# Patient Record
Sex: Female | Born: 1960 | Race: White | Hispanic: No | Marital: Married | Smoking: Never smoker
Health system: Southern US, Community
[De-identification: ages and names within clinical notes are randomized; demographics above are authoritative.]

## PROBLEM LIST (undated history)

## (undated) DIAGNOSIS — I10 Essential (primary) hypertension: Secondary | ICD-10-CM

## (undated) DIAGNOSIS — J302 Other seasonal allergic rhinitis: Secondary | ICD-10-CM

## (undated) DIAGNOSIS — N3281 Overactive bladder: Secondary | ICD-10-CM

## (undated) DIAGNOSIS — G4733 Obstructive sleep apnea (adult) (pediatric): Secondary | ICD-10-CM

## (undated) HISTORY — PX: BILATERAL TEMPOROMANDIBULAR JOINT ARTHROPLASTY: SUR77

## (undated) HISTORY — DX: Obstructive sleep apnea (adult) (pediatric): G47.33

## (undated) HISTORY — DX: Overactive bladder: N32.81

## (undated) HISTORY — PX: REDUCTION MAMMAPLASTY: SUR839

## (undated) HISTORY — DX: Essential (primary) hypertension: I10

## (undated) HISTORY — DX: Other seasonal allergic rhinitis: J30.2

---

## 1998-01-22 ENCOUNTER — Other Ambulatory Visit: Admission: RE | Admit: 1998-01-22 | Discharge: 1998-01-22 | Payer: Self-pay | Admitting: Obstetrics and Gynecology

## 1999-02-11 ENCOUNTER — Other Ambulatory Visit: Admission: RE | Admit: 1999-02-11 | Discharge: 1999-02-11 | Payer: Self-pay | Admitting: Obstetrics and Gynecology

## 1999-09-01 ENCOUNTER — Other Ambulatory Visit: Admission: RE | Admit: 1999-09-01 | Discharge: 1999-09-01 | Payer: Self-pay | Admitting: Obstetrics and Gynecology

## 1999-12-26 ENCOUNTER — Inpatient Hospital Stay (HOSPITAL_COMMUNITY): Admission: AD | Admit: 1999-12-26 | Discharge: 1999-12-26 | Payer: Self-pay | Admitting: Obstetrics and Gynecology

## 1999-12-26 ENCOUNTER — Encounter: Payer: Self-pay | Admitting: Obstetrics and Gynecology

## 2000-03-15 HISTORY — PX: TUBAL LIGATION: SHX77

## 2000-03-22 ENCOUNTER — Inpatient Hospital Stay (HOSPITAL_COMMUNITY): Admission: AD | Admit: 2000-03-22 | Discharge: 2000-03-25 | Payer: Self-pay | Admitting: Obstetrics and Gynecology

## 2000-03-22 ENCOUNTER — Encounter (INDEPENDENT_AMBULATORY_CARE_PROVIDER_SITE_OTHER): Payer: Self-pay | Admitting: Specialist

## 2000-04-15 ENCOUNTER — Encounter: Admission: RE | Admit: 2000-04-15 | Discharge: 2000-05-13 | Payer: Self-pay | Admitting: Obstetrics and Gynecology

## 2000-04-28 ENCOUNTER — Other Ambulatory Visit: Admission: RE | Admit: 2000-04-28 | Discharge: 2000-04-28 | Payer: Self-pay | Admitting: Obstetrics and Gynecology

## 2001-05-02 ENCOUNTER — Other Ambulatory Visit: Admission: RE | Admit: 2001-05-02 | Discharge: 2001-05-02 | Payer: Self-pay | Admitting: Obstetrics and Gynecology

## 2001-12-13 HISTORY — PX: ABDOMINAL HYSTERECTOMY: SHX81

## 2001-12-27 ENCOUNTER — Observation Stay (HOSPITAL_COMMUNITY): Admission: RE | Admit: 2001-12-27 | Discharge: 2001-12-28 | Payer: Self-pay | Admitting: Obstetrics and Gynecology

## 2001-12-27 ENCOUNTER — Encounter (INDEPENDENT_AMBULATORY_CARE_PROVIDER_SITE_OTHER): Payer: Self-pay | Admitting: *Deleted

## 2002-05-24 ENCOUNTER — Other Ambulatory Visit: Admission: RE | Admit: 2002-05-24 | Discharge: 2002-05-24 | Payer: Self-pay | Admitting: Obstetrics and Gynecology

## 2003-07-18 ENCOUNTER — Other Ambulatory Visit: Admission: RE | Admit: 2003-07-18 | Discharge: 2003-07-18 | Payer: Self-pay | Admitting: Obstetrics and Gynecology

## 2004-09-01 ENCOUNTER — Other Ambulatory Visit: Admission: RE | Admit: 2004-09-01 | Discharge: 2004-09-01 | Payer: Self-pay | Admitting: Obstetrics and Gynecology

## 2005-09-21 ENCOUNTER — Other Ambulatory Visit: Admission: RE | Admit: 2005-09-21 | Discharge: 2005-09-21 | Payer: Self-pay | Admitting: Obstetrics and Gynecology

## 2008-05-16 ENCOUNTER — Encounter: Admission: RE | Admit: 2008-05-16 | Discharge: 2008-05-16 | Payer: Self-pay | Admitting: Obstetrics and Gynecology

## 2008-05-16 IMAGING — MG MM SCREEN MAMMOGRAM BILATERAL
4 series · 4 of 4 positions shown · non-contrast
Comparison: none

DG SCREEN MAMMOGRAM BILATERAL
Bilateral CC and MLO view(s) were taken.

DIGITAL SCREENING MAMMOGRAM WITH CAD:
There are scattered fibroglandular densities.  No masses or malignant type calcifications are 
identified.  Compared with prior studies.

[R CC]
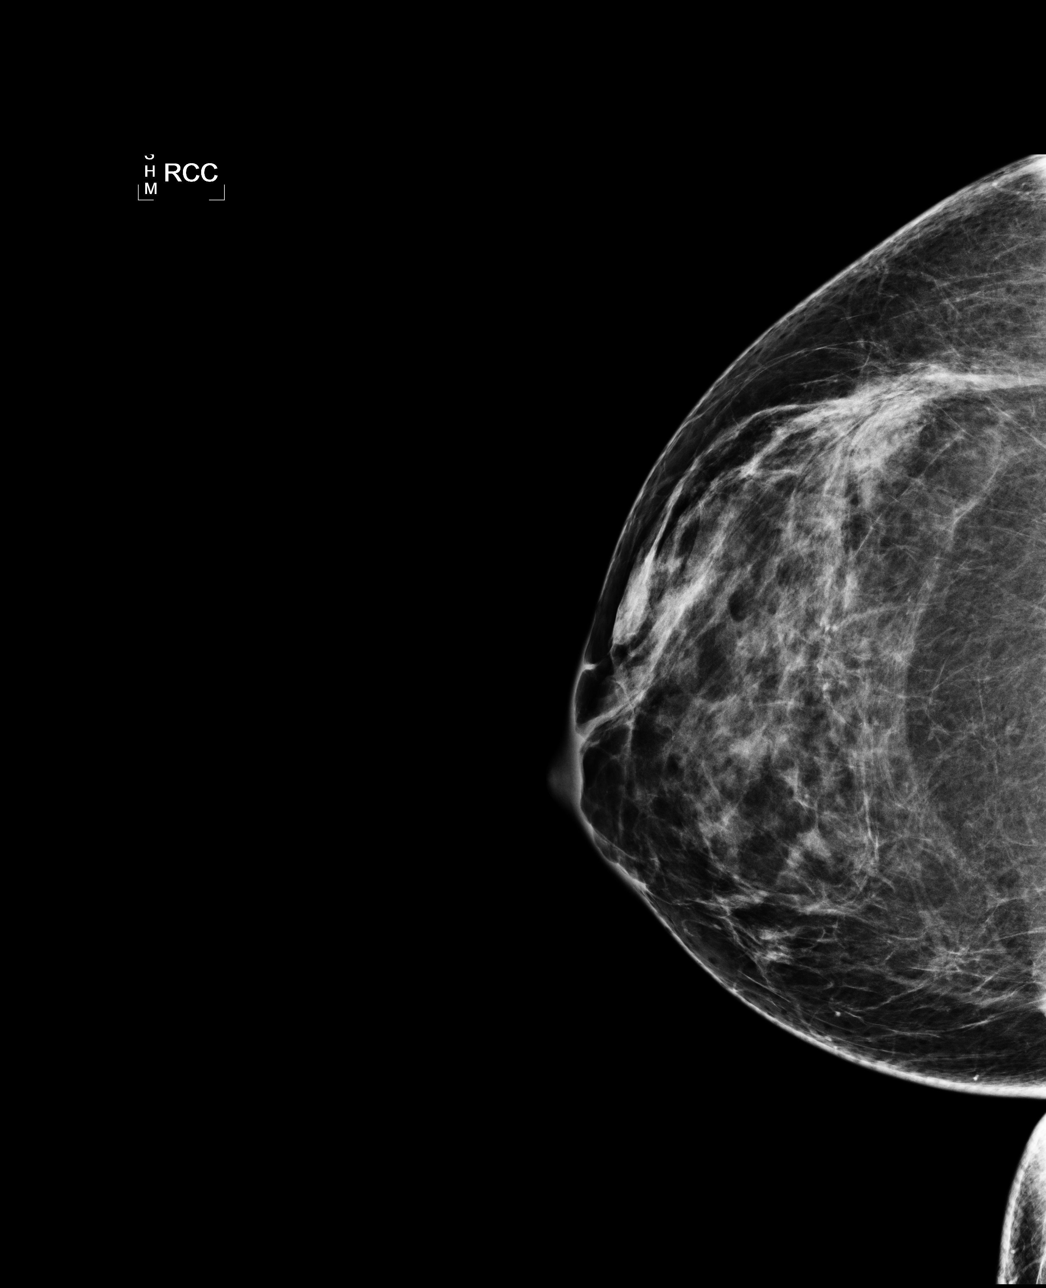

[L CC]
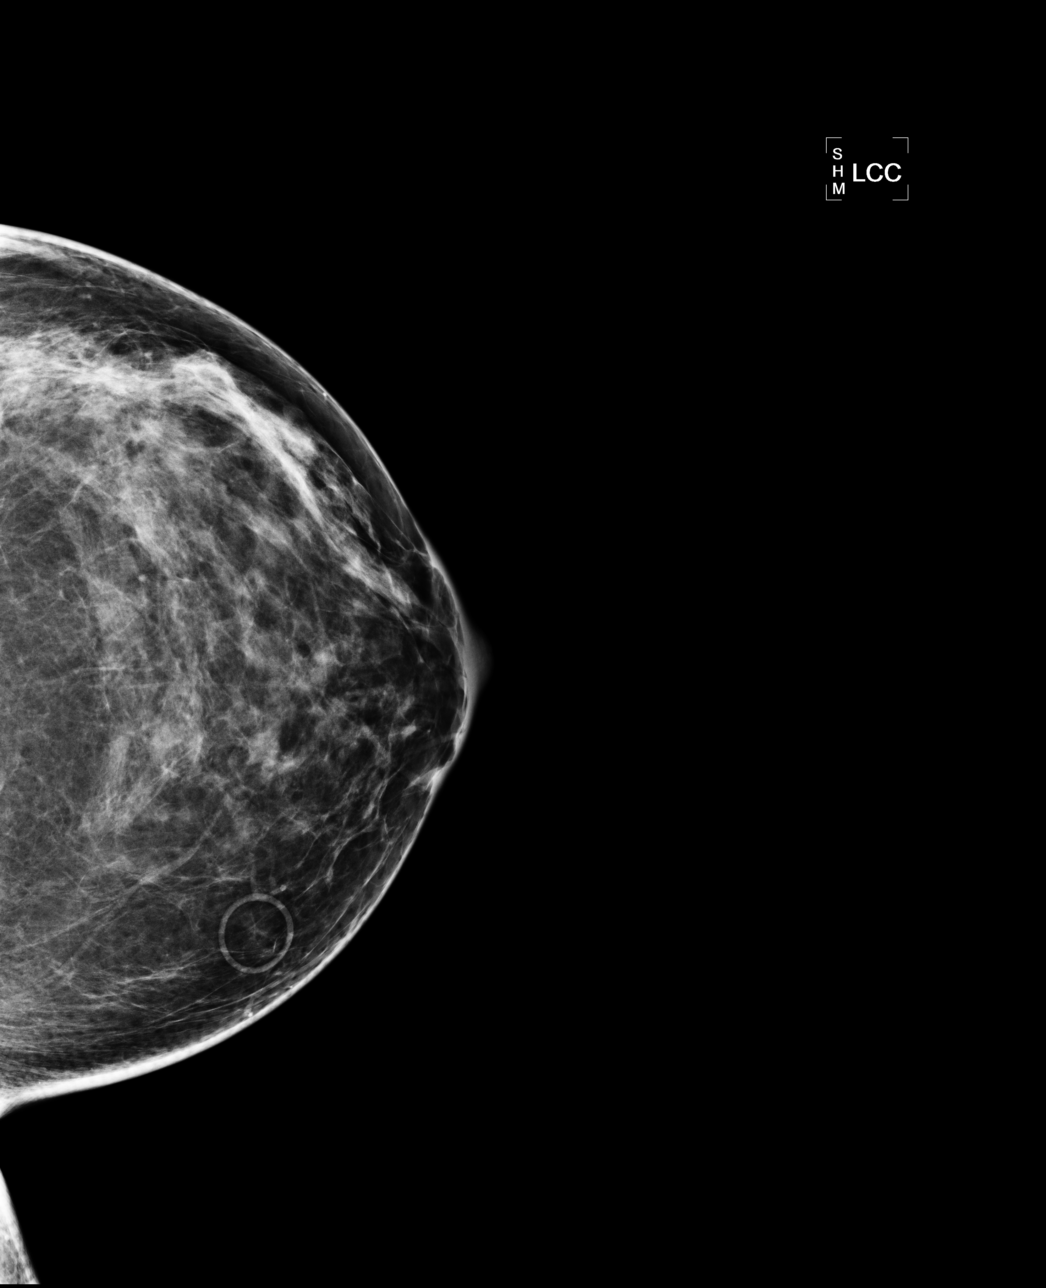

[L MLO]
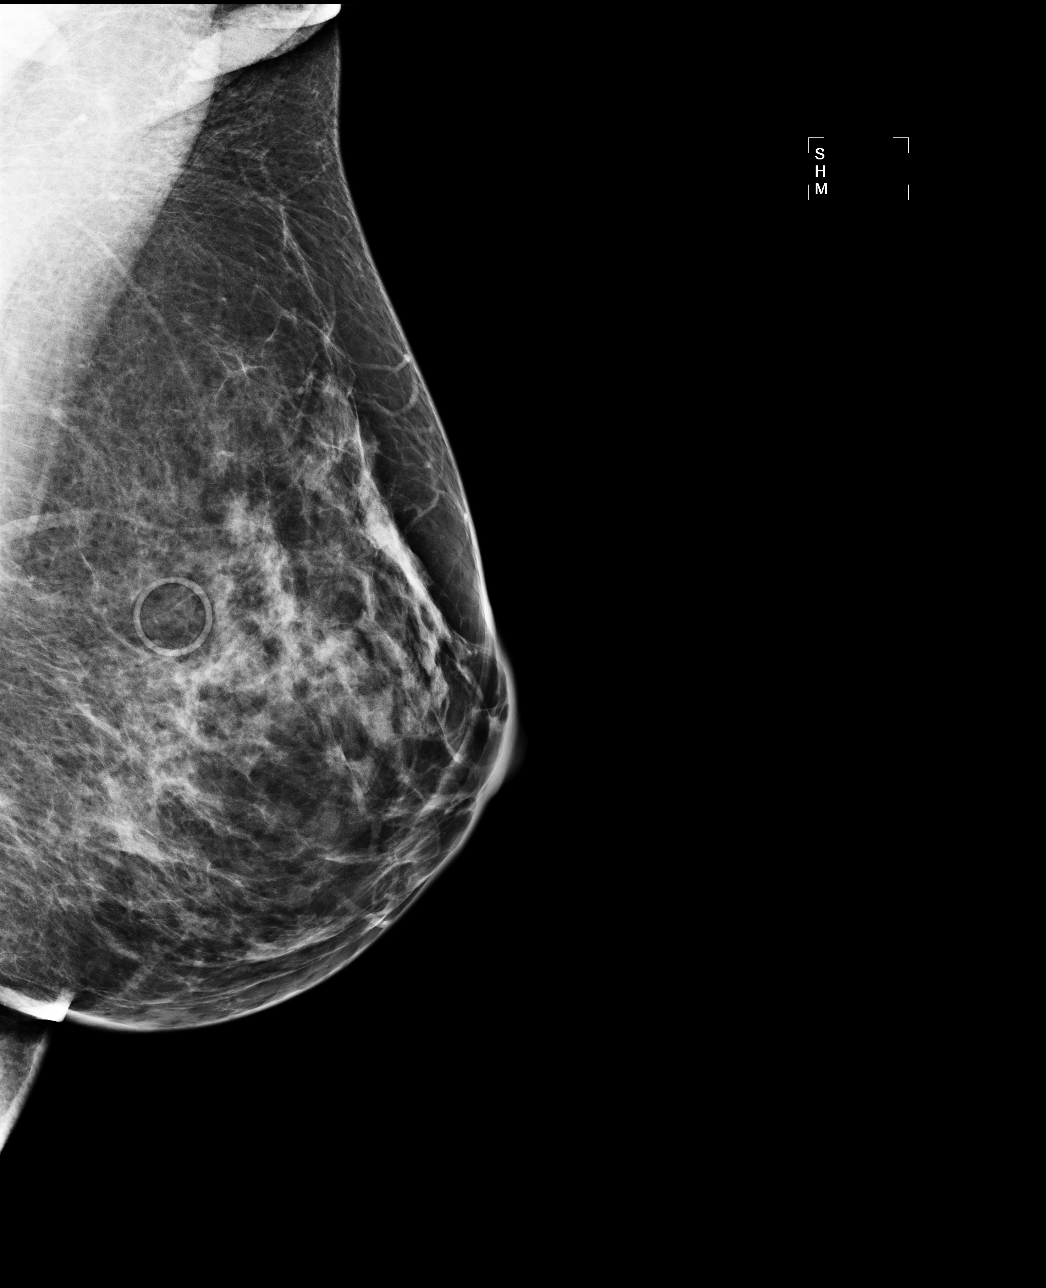

[R MLO]
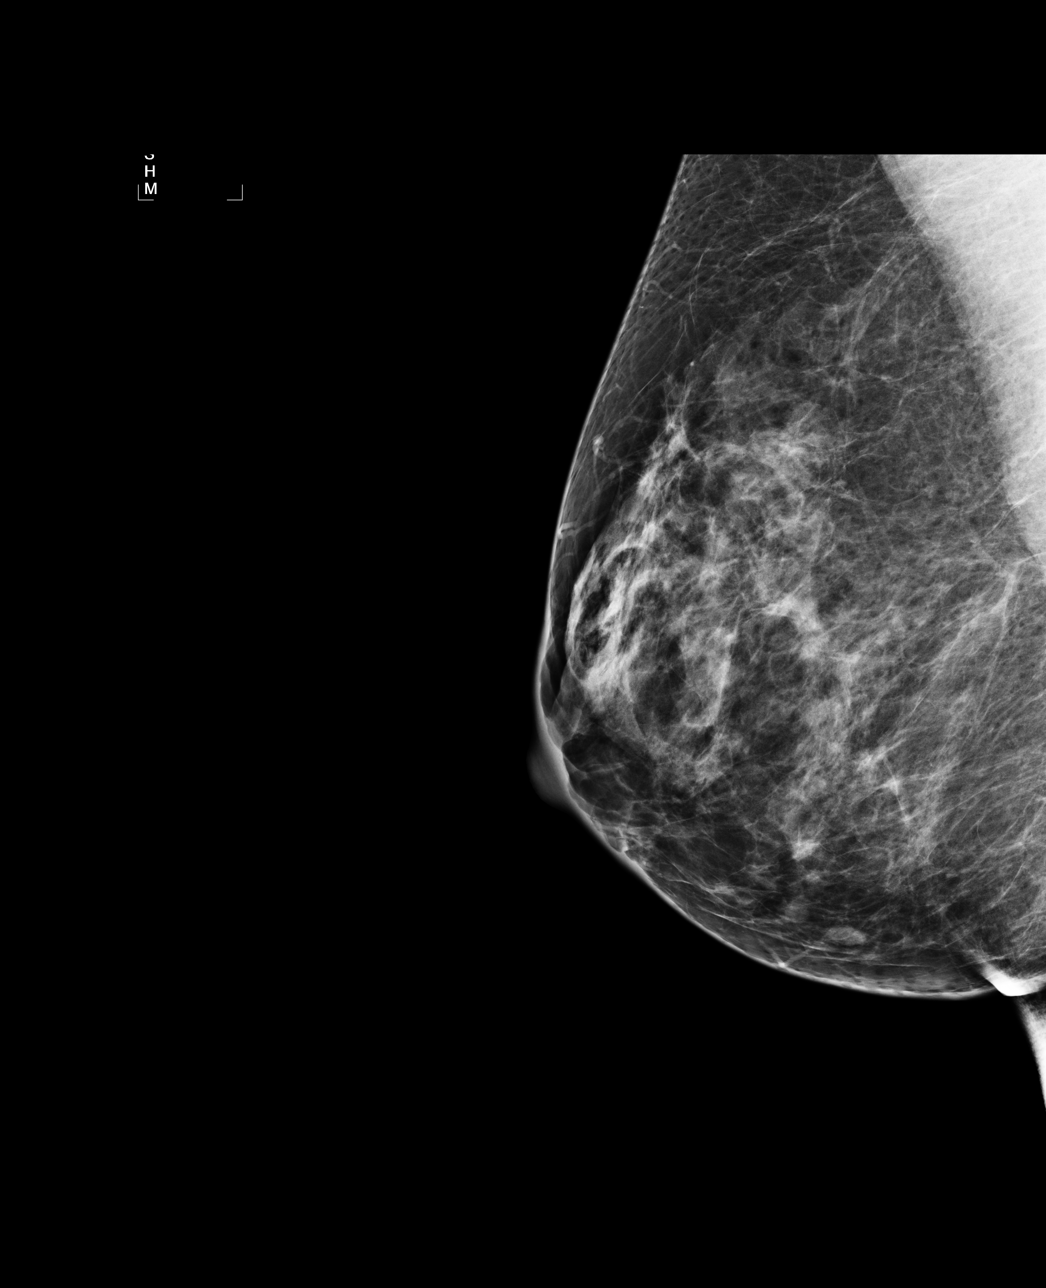

[4 of 4 positions shown; findings below may reference images not displayed]

IMPRESSION: No specific mammographic evidence of malignancy.  Next screening mammogram is recommended in one 
year.

ASSESSMENT: Negative - BI-RADS 1

Screening mammogram in 1 year.
ANALYZED BY COMPUTER AIDED DETECTION. , THIS PROCEDURE WAS A DIGITAL MAMMOGRAM.

## 2009-05-17 ENCOUNTER — Encounter: Admission: RE | Admit: 2009-05-17 | Discharge: 2009-05-17 | Payer: Self-pay | Admitting: Obstetrics and Gynecology

## 2009-05-17 IMAGING — MG MM DIGITAL SCREENING
4 series · 4 of 4 positions shown · non-contrast
Comparison: none

DG SCREEN MAMMOGRAM BILATERAL
Bilateral CC and MLO view(s) were taken.
Technologist: MORYS(MORYS)

DIGITAL SCREENING MAMMOGRAM WITH CAD:
The breast tissue is heterogeneously dense.  No masses or malignant type calcifications are 
identified.  Compared with prior studies.
Images were processed with CAD.

[R CC]
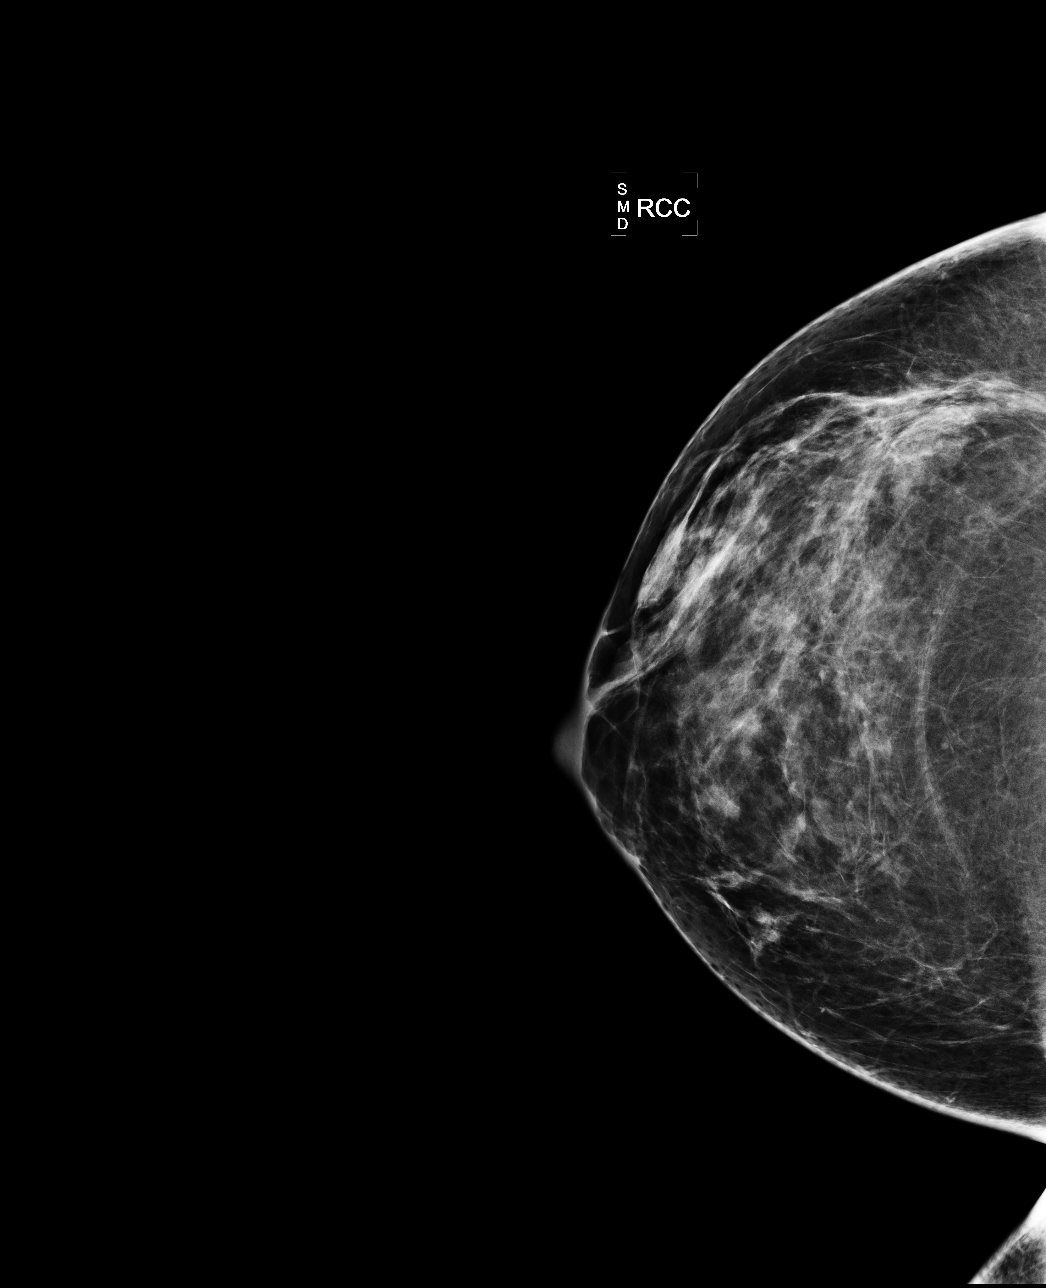

[L CC]
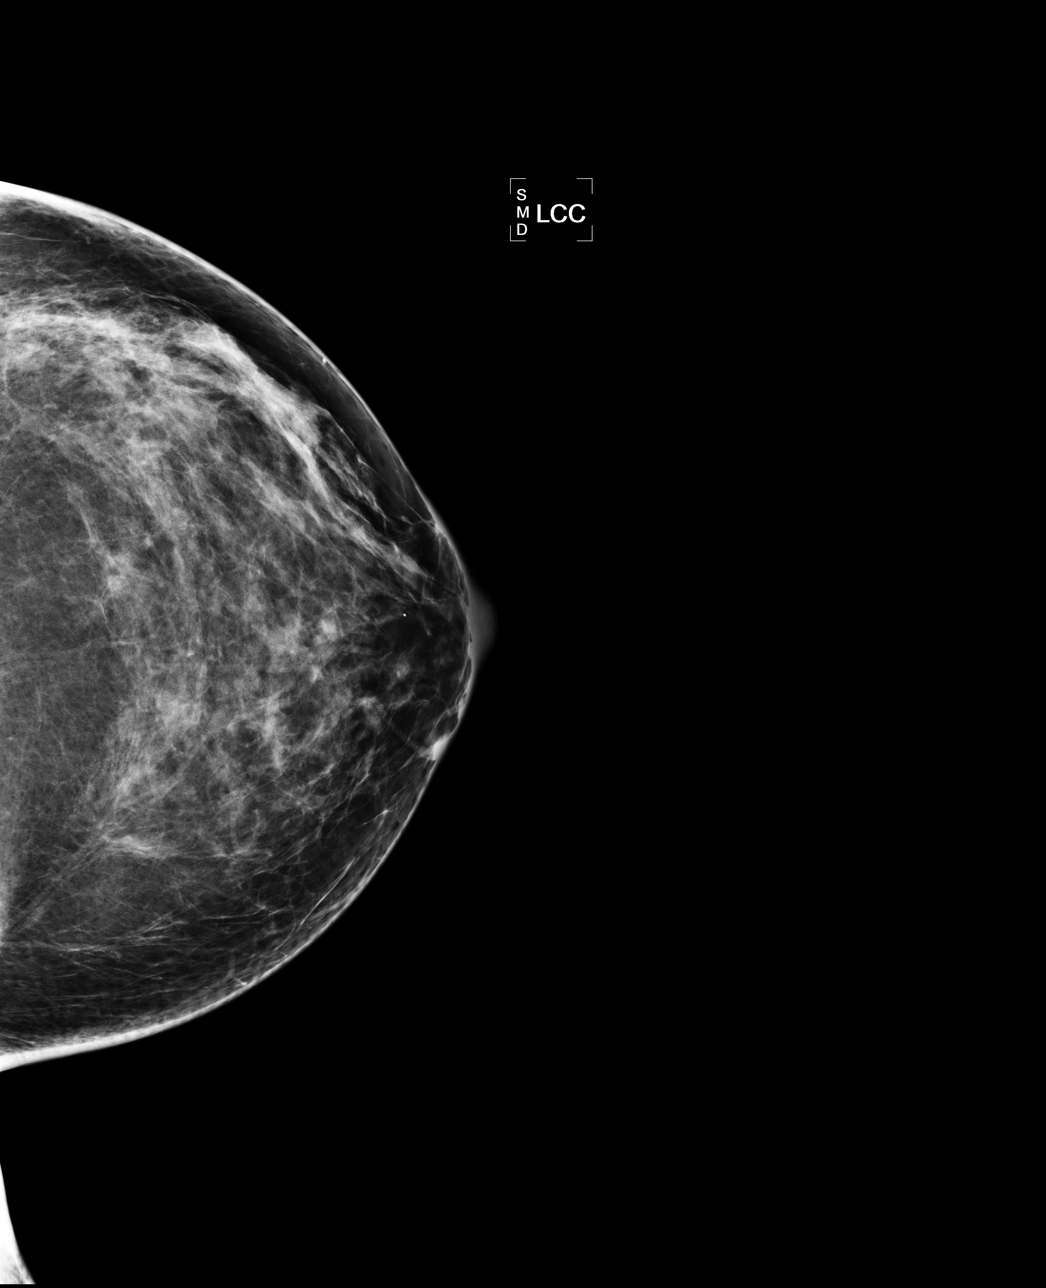

[L MLO]
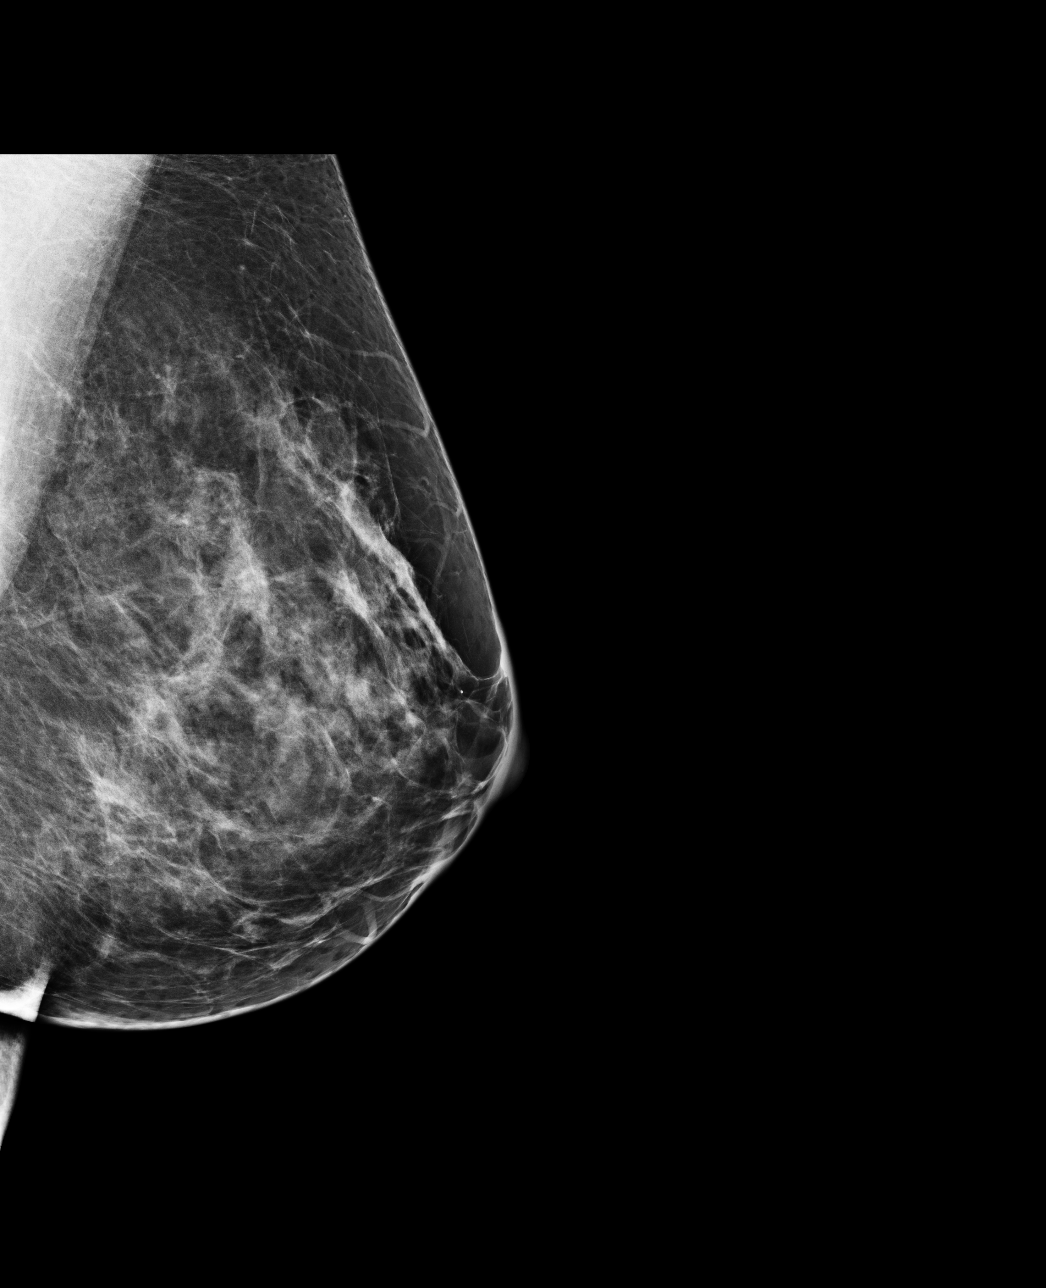

[R MLO]
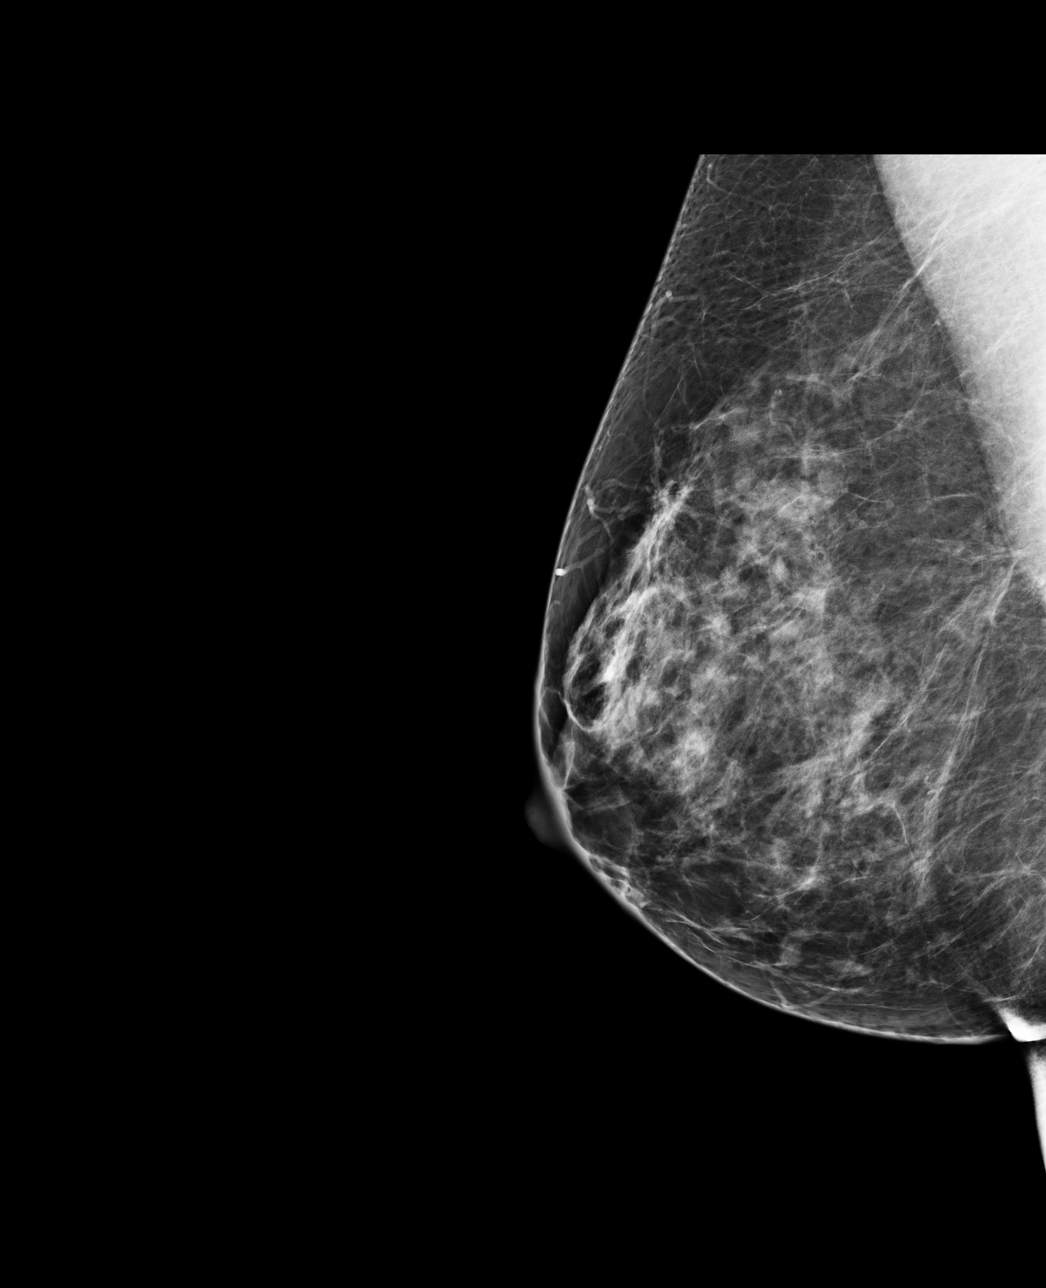

[4 of 4 positions shown; findings below may reference images not displayed]

IMPRESSION: No specific mammographic evidence of malignancy.  Next screening mammogram is recommended in one 
year.

A result letter of this screening mammogram will be mailed directly to the patient.

ASSESSMENT: Negative - BI-RADS 1

Screening mammogram in 1 year.
,

## 2010-01-08 ENCOUNTER — Ambulatory Visit (HOSPITAL_COMMUNITY): Admission: RE | Admit: 2010-01-08 | Discharge: 2010-01-08 | Payer: Self-pay | Admitting: Obstetrics and Gynecology

## 2010-05-20 ENCOUNTER — Encounter: Admission: RE | Admit: 2010-05-20 | Discharge: 2010-05-20 | Payer: Self-pay | Admitting: Obstetrics and Gynecology

## 2010-05-20 IMAGING — MG MM DIGITAL SCREENING
4 series · 4 of 4 positions shown · non-contrast
Comparison: none

DG SCREEN MAMMOGRAM BILATERAL
Bilateral CC and MLO view(s) were taken.

DIGITAL SCREENING MAMMOGRAM WITH CAD:
There are scattered fibroglandular densities.  A possible mass is noted in the left breast.  Spot 
compression views and possibly sonography are recommended for further evaluation.  In the right 
breast, no masses or malignant type calcifications are identified.  Compared with prior studies.
Images were processed with CAD.

[R CC]
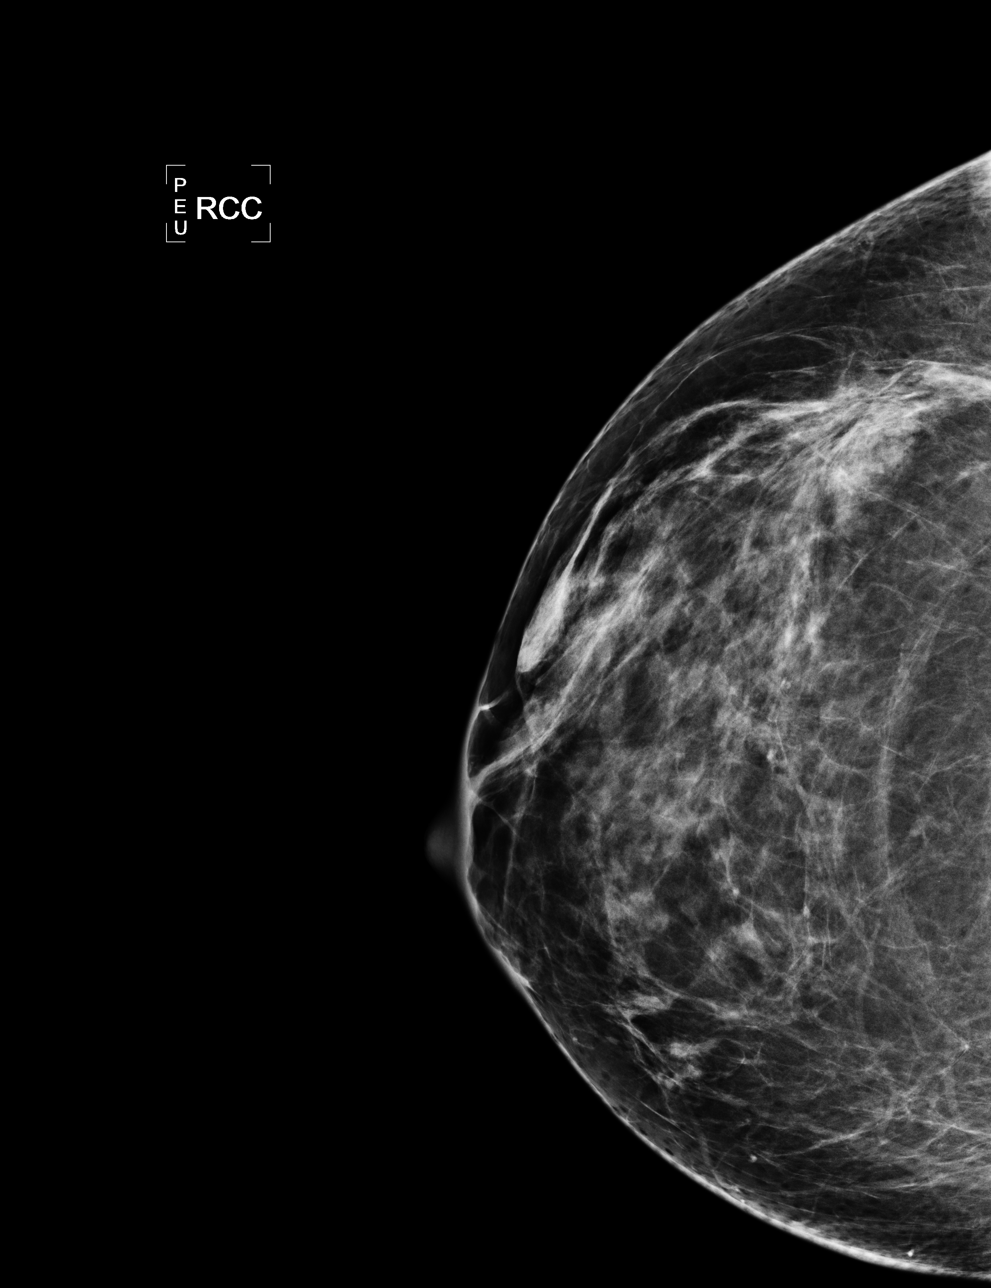

[L CC]
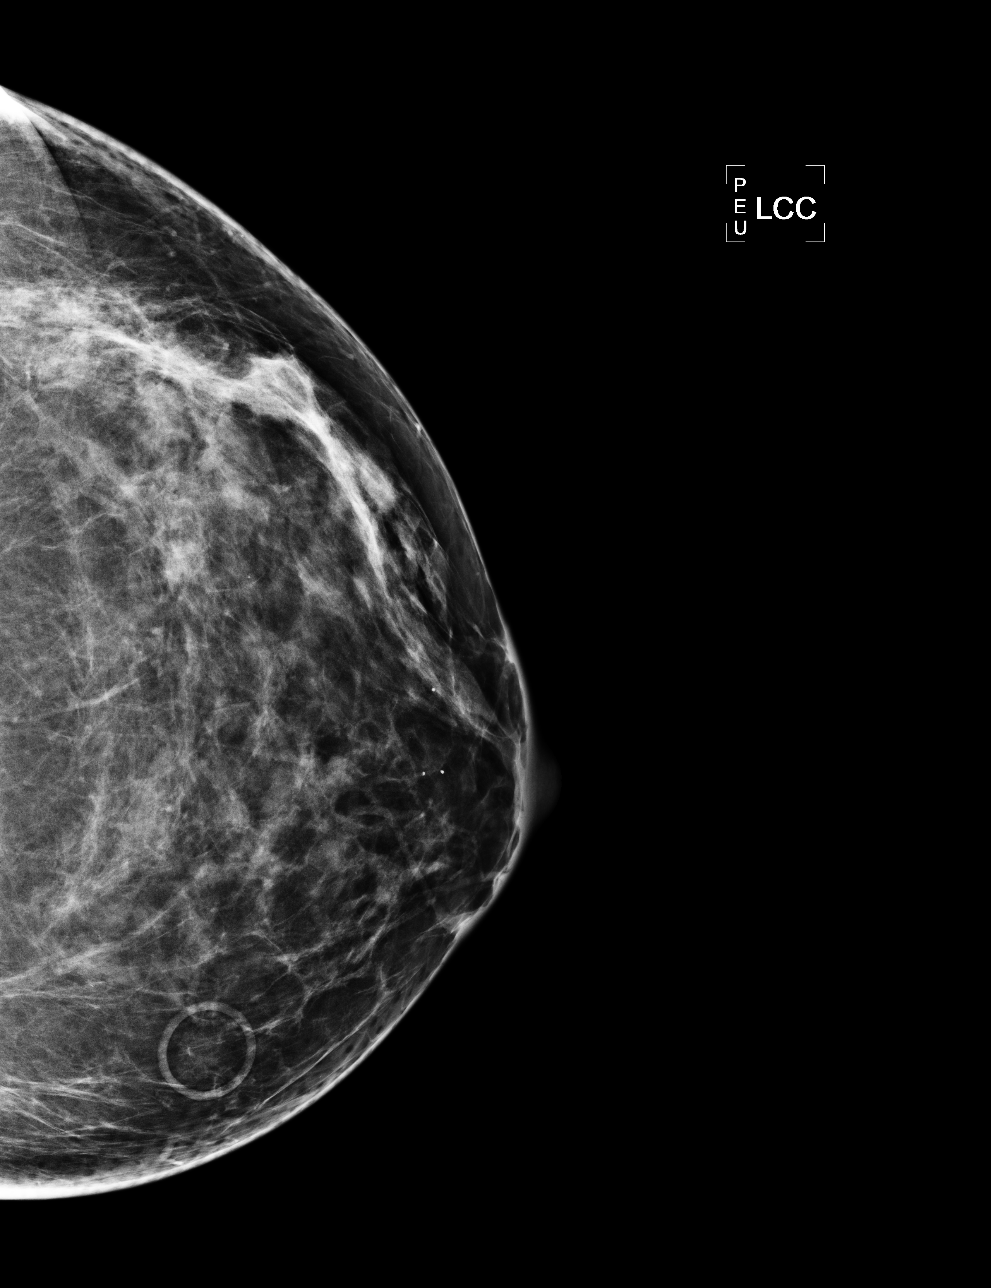

[L MLO]
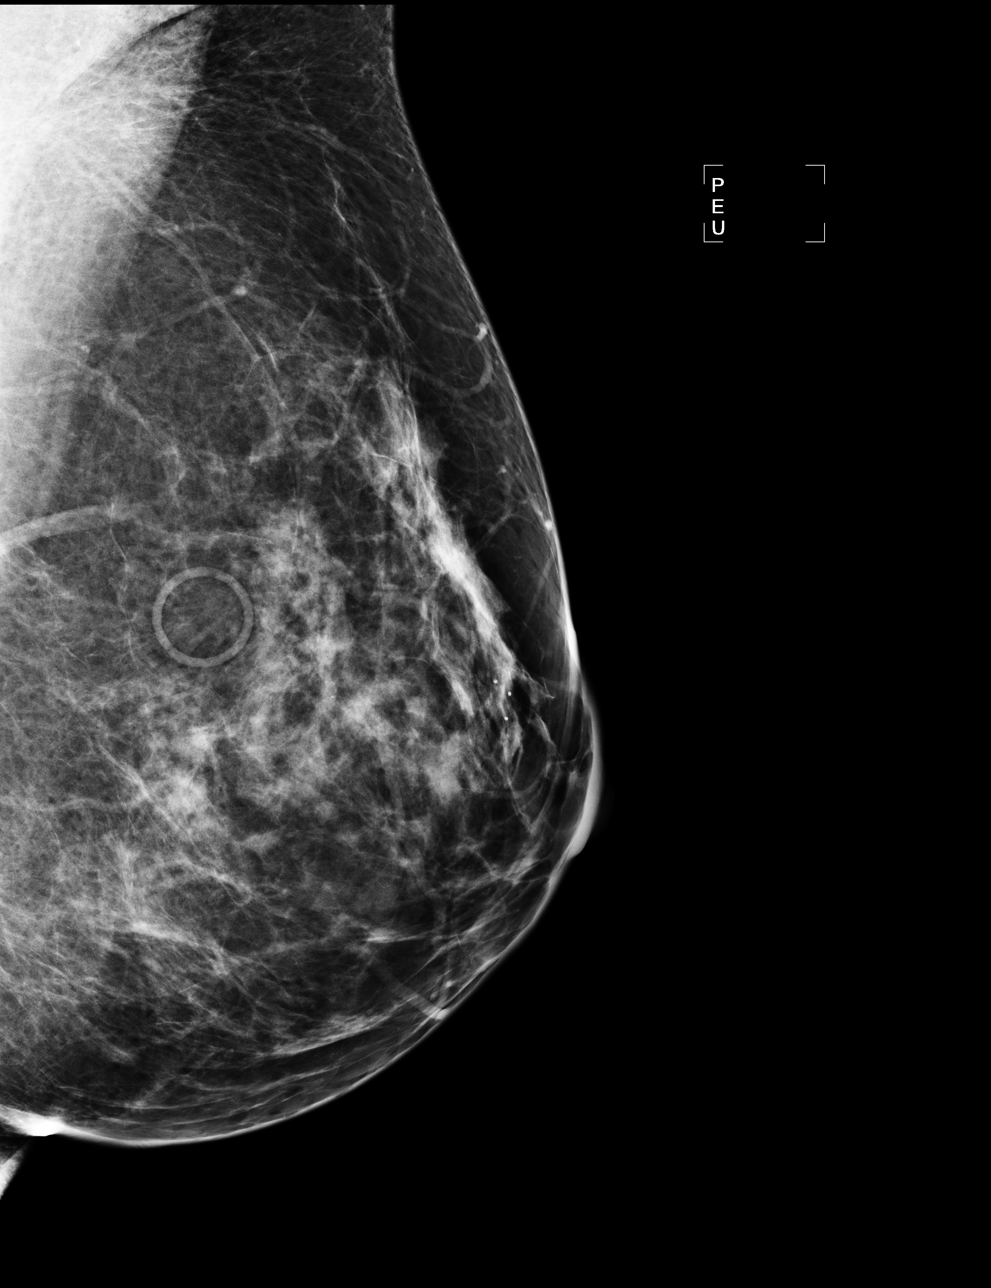

[R MLO]
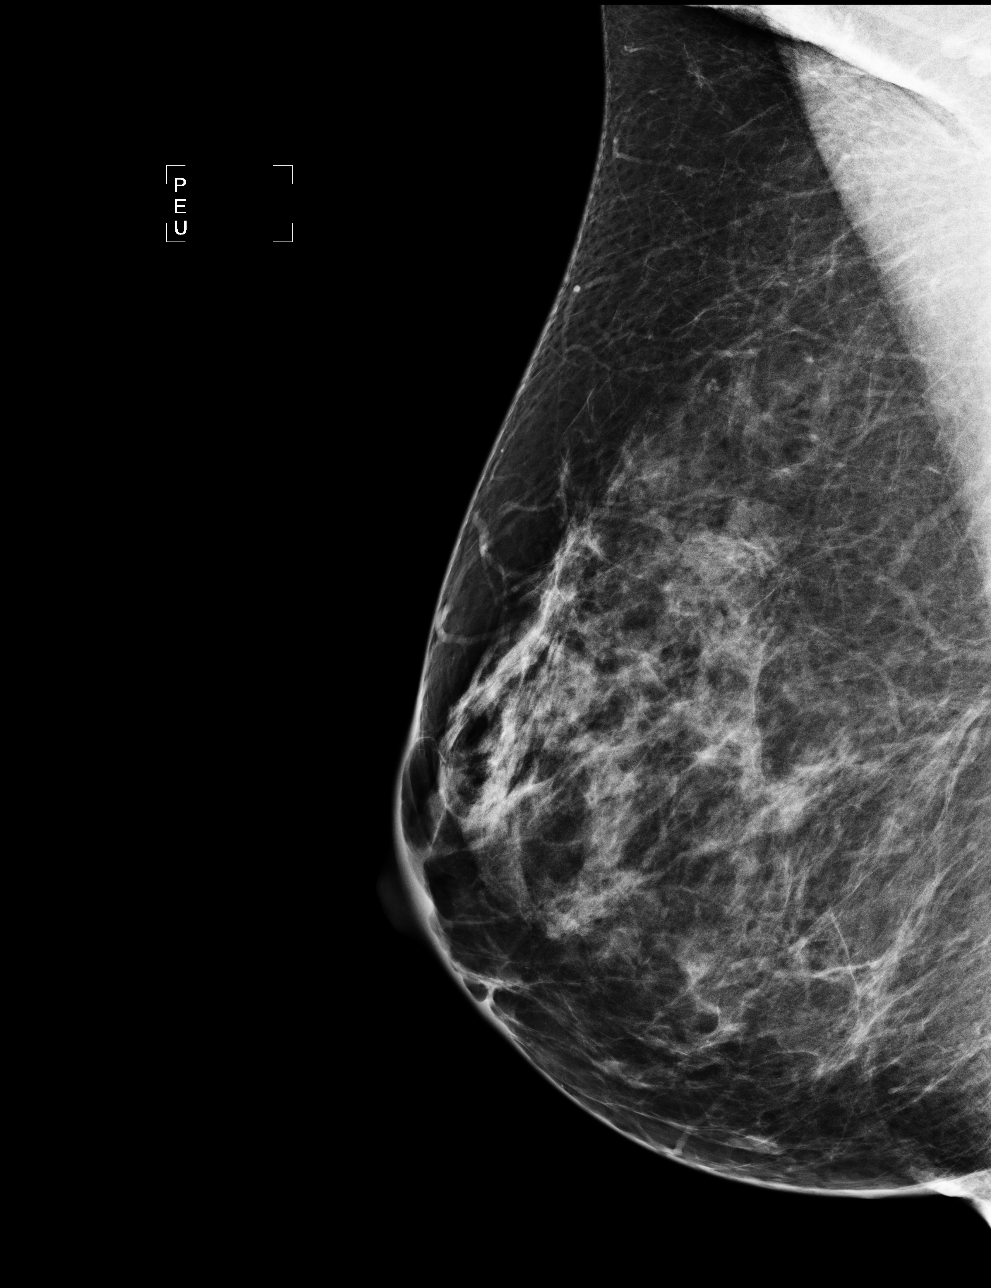

[4 of 4 positions shown; findings below may reference images not displayed]

IMPRESSION: Possible mass, left breast.  Additional evaluation is indicated.  The patient will be contacted for
additional studies and a supplementary report will follow.  No specific mammographic evidence of 
malignancy, right breast.

ASSESSMENT: Need additional imaging evaluation and/or prior mammograms for comparison - BI-RADS 0

Further imaging of the left breast.
,

## 2010-05-25 ENCOUNTER — Emergency Department (HOSPITAL_COMMUNITY)
Admission: EM | Admit: 2010-05-25 | Discharge: 2010-05-25 | Payer: Self-pay | Source: Home / Self Care | Admitting: Emergency Medicine

## 2010-05-27 ENCOUNTER — Encounter
Admission: RE | Admit: 2010-05-27 | Discharge: 2010-05-27 | Payer: Self-pay | Source: Home / Self Care | Attending: Obstetrics and Gynecology | Admitting: Obstetrics and Gynecology

## 2010-05-27 IMAGING — MG MM DIGITAL DIAG LTD L
4 series · 4 of 4 positions shown · non-contrast
Comparison: [DATE], [DATE]

CLINICAL DATA: The patient returns for evaluation of a possible
mass in the left breast noted on recent screening study dated
[DATE].

[REDACTED] MAMMOGRAM WITH CAD

[L CC (1 of 2)]
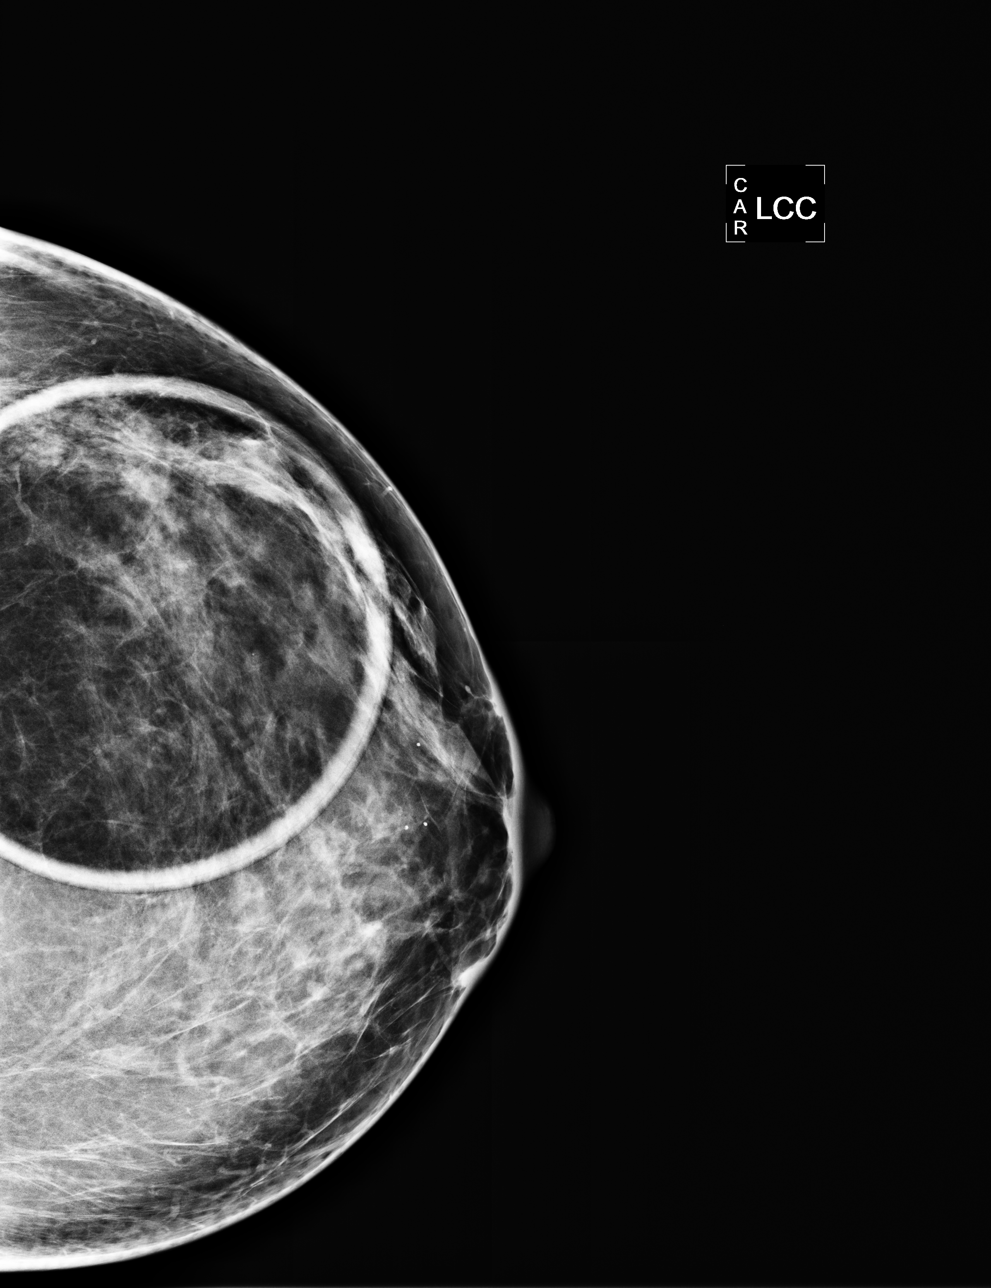

[L CC (2 of 2)]
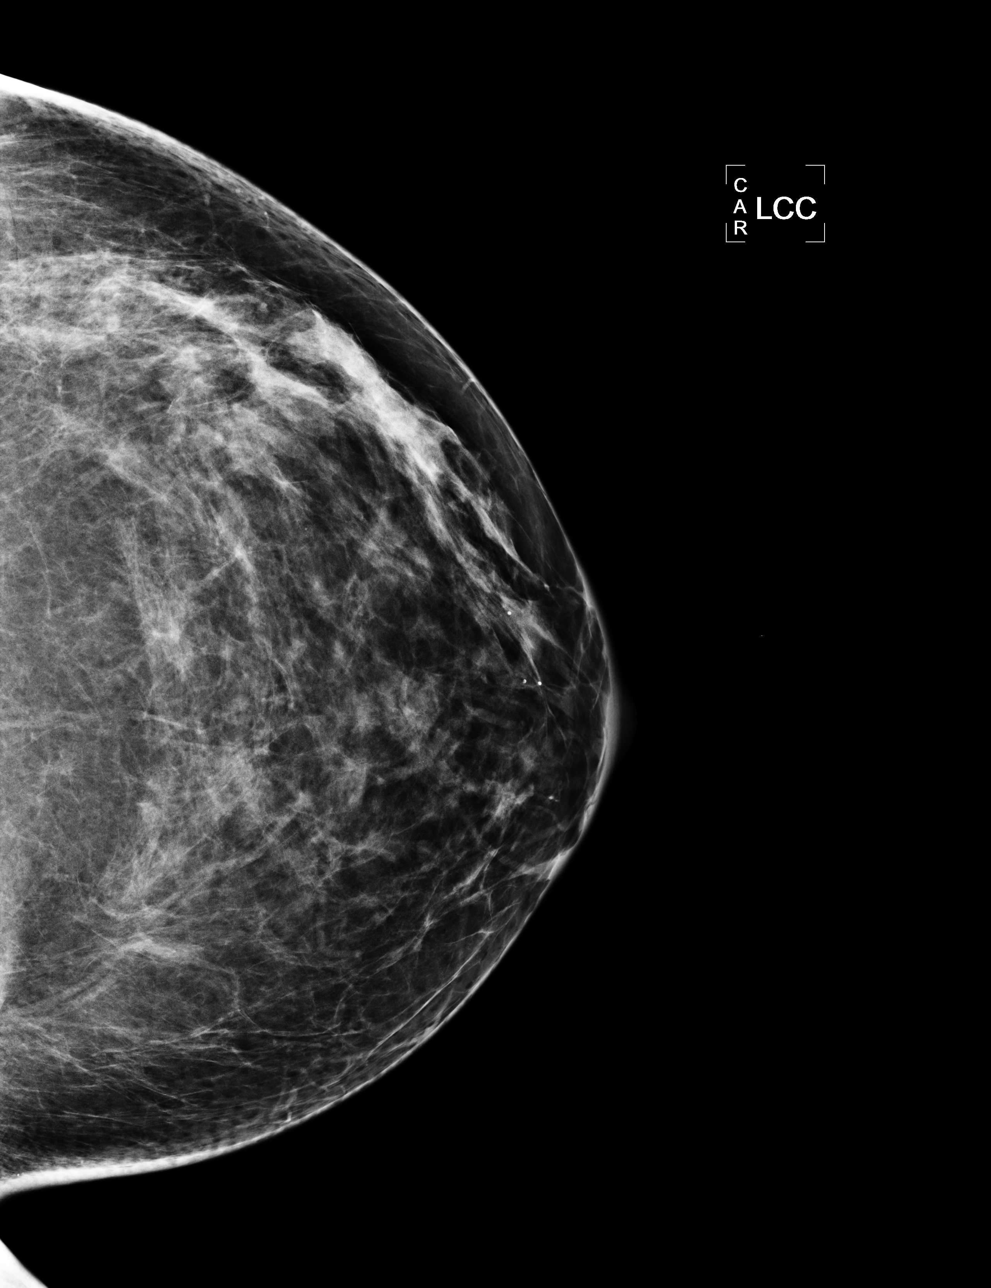

[L MLO]
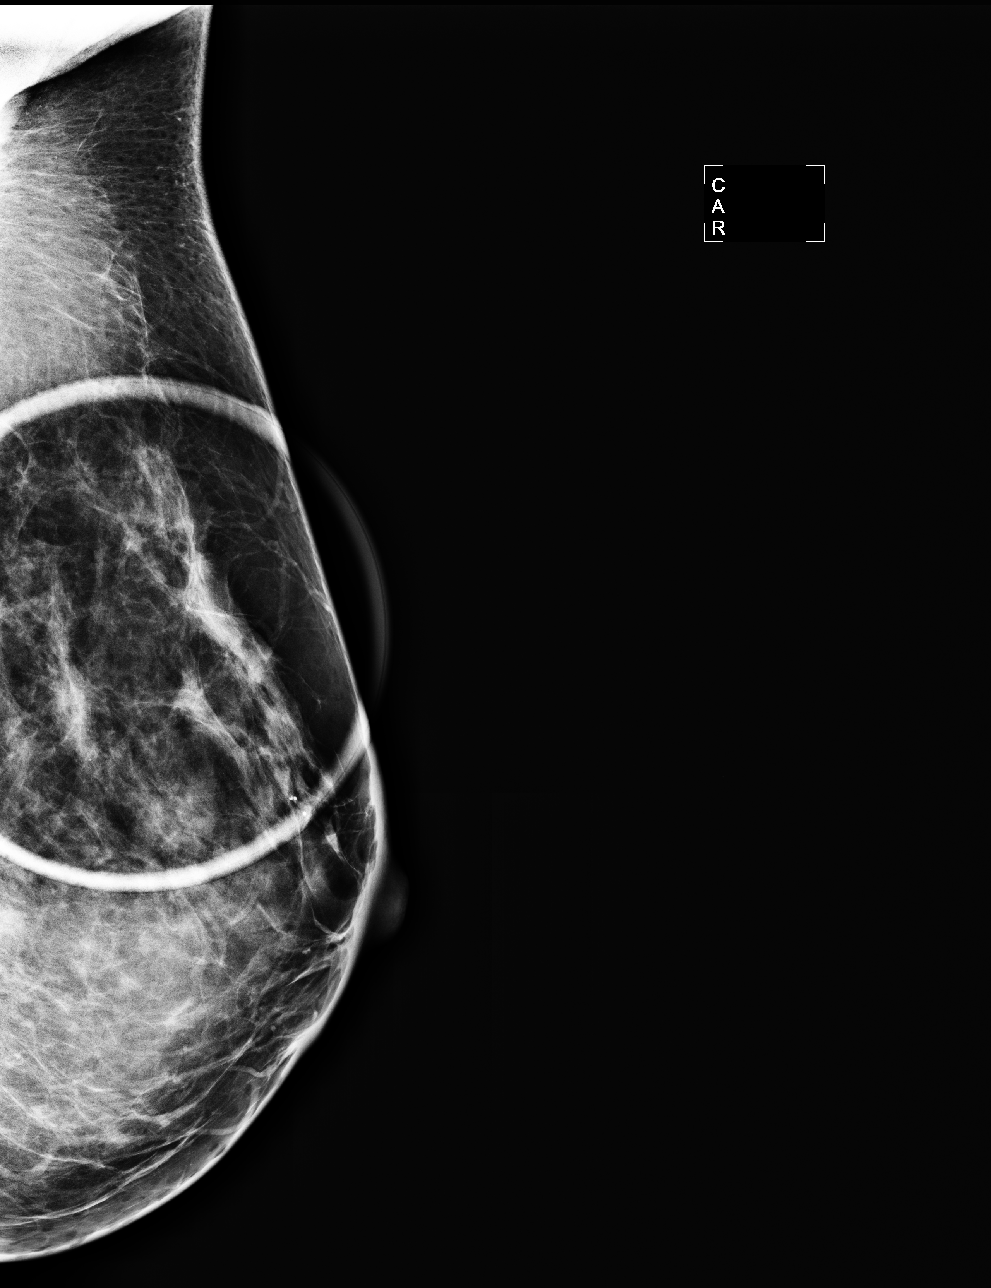

[L ML]
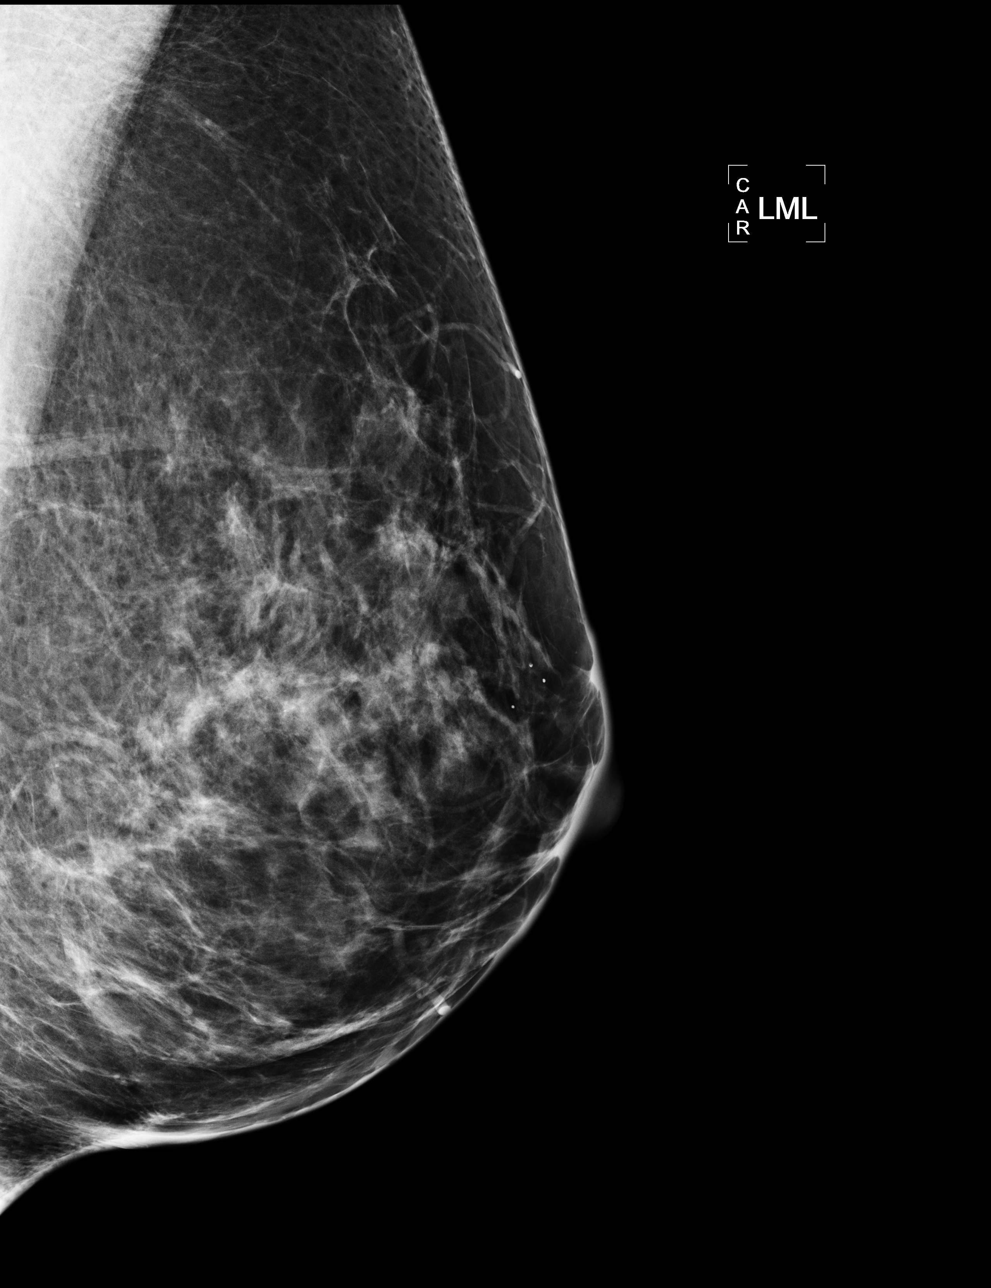

[4 of 4 positions shown; findings below may reference images not displayed]

FINDINGS: Additional views demonstrate no persistent worrisome
mass or distortion in the left breast.  Reduction mammoplasty
changes are present.
Mammographic images were processed with CAD.
IMPRESSION: No persistent worrisome abnormality upon additional imaging of the
breast.  Yearly screening mammography is suggested.

BI-RADS CATEGORY 2:  Benign finding(s).

## 2010-07-06 ENCOUNTER — Encounter: Payer: Self-pay | Admitting: Obstetrics and Gynecology

## 2010-08-30 LAB — CBC
HCT: 40.1 % (ref 36.0–46.0)
Hemoglobin: 13.6 g/dL (ref 12.0–15.0)
MCHC: 33.9 g/dL (ref 30.0–36.0)

## 2010-08-30 LAB — URINALYSIS, ROUTINE W REFLEX MICROSCOPIC
Glucose, UA: NEGATIVE mg/dL
Protein, ur: NEGATIVE mg/dL
Specific Gravity, Urine: 1.01 (ref 1.005–1.030)
pH: 6.5 (ref 5.0–8.0)

## 2010-08-30 LAB — URINE MICROSCOPIC-ADD ON

## 2010-08-30 LAB — SURGICAL PCR SCREEN
MRSA, PCR: NEGATIVE
Staphylococcus aureus: POSITIVE — AB

## 2010-10-31 NOTE — Discharge Summary (Signed)
[facility] of [facility]  Patient:    Maria Stewart, Maria Stewart Visit Number: [id number] MRN: [id number]          Service Type: DSU Location: 910A [id number] [id number] Attending Physician:  Soledad Gerlach Dictated by:   Guy Sandifer Arleta Creek, M.D. Admit Date:  12/27/2001 Discharge Date: 12/28/2001                             Discharge Summary  ADMISSION DIAGNOSIS:  Menorrhagia.  DISCHARGE DIAGNOSIS:  Menorrhagia.  PROCEDURE:  On 12/27/01, laparoscopically assisted vaginal hysterectomy.  REASON FOR ADMISSION:  The patient is a 50 year old married white female, G3, P3, status post tubal ligation with increasing menorrhagia.  Details are dictated in the history and physical.  HOSPITAL COURSE:  The patient has been in the hospital to undergo the above procedure.  Estimated blood loss was 450 cc.  On the evening of surgery, she has good pain relief.  Vital signs are stable, she is afebrile with clear urine output.  Abdomen is soft with positive bowel sounds.  On the day of discharge, she has been ambulating well, tolerating liquids and some bites of food for breakfast.  She is afebrile and vital signs are stable.  Hemoglobin is 10.3, and white blood cell count is 13.3.  She will be discharged after lunch if she is tolerating lunch well.  CONDITION ON DISCHARGE:  Good.  DIET:  Regular as tolerated.  ACTIVITY:  No lifting, no operation of automobiles, no vaginal entry.  DISCHARGE INSTRUCTIONS:  She is to call the office for problems, including, but not limited to temperature of 101 or greater, increasing pain, persistent nausea and vomiting, or heavy vaginal bleeding.  DISCHARGE MEDICATIONS: 1. Percocet 5/325 mg #30 one or two p.o. q.6h. p.r.n. 2. Ibuprofen 600 mg p.o. q.6h. p.r.n. 3. Multivitamin q.d.  FOLLOWUP:  In the office in two weeks. Dictated by:   Guy Sandifer Arleta Creek, M.D. Attending Physician:  Soledad Gerlach DD:  12/28/01 TD:  12/30/01 Job:  [id number]

## 2010-10-31 NOTE — H&P (Signed)
[facility] of [facility]  Patient:    Maria Stewart, Maria Stewart Visit Number: [id number] MRN: [id number]          Service Type: Attending:  Guy Sandifer. Arleta Creek, M.D. Dictated by:   Guy Sandifer Arleta Creek, M.D. Adm. Date:  12/27/01                           History and Physical  CHIEF COMPLAINT:              Menometrorrhagia.  HISTORY OF PRESENT ILLNESS:   This patient is a 50 year old married white female, G3, P3, status post tubal ligation with known uterine leiomyomata and endometriosis that has increasingly heavy vaginal bleeding and dysmenorrhea.  After a careful discussion of the options, she is being admitted for laparoscopically-assisted vaginal hysterectomy and removal of one ovary only if distinctly abnormal.  Potential risks and complications have been discussed.  PAST MEDICAL HISTORY:         1. Endometriosis.                               2. Uterine leiomyomata.                               3. Depression.  PAST SURGICAL HISTORY:        1. Osteotomy x 2.                               2. Dilatation and curettage in 1996.                               3. Laparoscopy 1996.                               4. Tonsillectomy age 14.  OBSTETRICAL HISTORY:          Vaginal delivery x 3.  FAMILY HISTORY:               Kidney stones in maternal grandfather. Nephrectomy in paternal grandmother.  Renal carcinoma in paternal grandmother. Breast cancer in maternal grandmother.  Breast cancer in maternal great-grandmother.  Bipolar disease in sister.  Emphysema in paternal grandfather.  Chronic hypertension in maternal grandfather and mother.  MEDICATIONS:                  Vitamins and calcium.  ALLERGIES:                    No known drug allergies.  SOCIAL HISTORY:               The patient denies tobacco, alcohol, or drug abuse.  REVIEW OF SYSTEMS:            The patient has had some complaints of mild stress urinary incontinence.  Evaluation by Dr. Windy Fast L. Davis  III for significant stress urinary incontinence.  He did not feel it required correction at this time.  PHYSICAL EXAMINATION:  VITAL SIGNS:                  Height 5 feet 4 inches.  Weight 164 pounds. Blood pressure 114/78.  NECK:  Without thyromegaly.  LUNGS:                        Clear to auscultation.  HEART:                        Regular rate and rhythm.  BACK:                         Without CVA tenderness.  BREASTS:                      Without masses, tracks, or discharge.  ABDOMEN:                      Soft and nontender without masses.  PELVIC:                       Vulva, vagina, and cervix without lesion.  Small cystocele and rectocele noted.  Uterus upper limits of normal size.  Adnexa nontender without masses.  EXTREMITIES:                  Grossly within normal limits.  NEUROLOGICAL:                 Grossly within normal limits.  ASSESSMENT:                   Menorrhagia.  PLAN:                         Laparoscopically-assisted vaginal hysterectomy and removal of one ovary is distinctly abnormal. Dictated by:   Guy Sandifer. Arleta Creek, M.D. Attending:  Guy Sandifer Arleta Creek, M.D. DD:  12/26/01 TD:  12/26/01 Job: [id number]

## 2010-10-31 NOTE — Op Note (Signed)
[facility] of [facility]  Patient:    Maria Stewart, Maria Stewart Visit Number: [id number] MRN: [id number]          Service Type: DSU Location: 910A [id number] [id number] Attending Physician:  Soledad Gerlach Dictated by:   Guy Sandifer Arleta Creek, M.D. Proc. Date: 12/27/01 Admit Date:  12/27/2001 Discharge Date: 12/28/2001                             Operative Report  PREOPERATIVE DIAGNOSES:       Menorrhagia.  POSTOPERATIVE DIAGNOSES:      Menorrhagia.  PROCEDURE:                    Laparoscopically assisted vaginal hysterectomy.  SURGEON:                      Guy Sandifer. Arleta Creek, M.D.  ASSISTANT:                    Juluis Mire, M.D.  ESTIMATED BLOOD LOSS:         450 cc.  ANESTHESIA:                   General with endotracheal Georganna Skeans, M.D.  INDICATIONS AND CONSENT:      This patient is a 50 year old married white female G3, P3 status post tubal ligation with increasingly heavy menorrhagia and dysmenorrhea.  Details have been dictated in history and physical. Laparoscopically assisted vaginal hysterectomy and removal of one ovary only if distinctly abnormal is discussed.  Potential risks and complications have been discussed preoperatively including, but not limited to, infection, bowel, bladder, ureteral damage, bleeding requiring transfusion of blood products with possible transfusion reaction, HIV and hepatitis acquisition, DVT, PE, pneumonia, laparotomy, postoperative dyspareunia, postoperative pain.  All questions have been answered and consent is signed on the chart.  FINDINGS:                     Upper abdomen is grossly normal.  Uterus is 6-8 weeks in size.  Left ovary has a 2 cm translucent cyst.  Right ovary is normal.  Tubes are status post ligation bilaterally.  Anterior/posterior cul-de-sacs are normal.  PROCEDURE:                    The patient is taken to the operating room, placed in the dorsal supine position where general  anesthesia is induced via endotracheal intubation.  She is then placed in the dorsal lithotomy position where she is prepped abdominally and vaginally.  Bladder straight catheterized.  Hulka tenaculum is placed in the uterus as a manipulator and she is draped in a sterile fashion.  A small infraumbilical incision is made and the gyrus disposable trocar sleeve is placed in the umbilicus without difficulty.  Placement is verified with the laparoscope and no damage to surrounding structures is noted.  Pneumoperitoneum is induced.  A small suprapubic incision is made and a 5 mm nondisposable trocar sleeve was placed under direct visualization without difficulty.  The above findings are noted. The left ovarian cyst is punctured, but little fluid is actually aspirated.  A small amount of bleeding from the capsule is noted and is controlled with bipolar cautery.  Then using the gyrus coagulation and cutting instrument, the proximal ligaments are taken down bilaterally down to the level of the vesicouterine peritoneum.  There is a  fair amount of back bleeding from the uterus which is controlled first with the gyrus and then with a regular bipolar forceps.  Excess clot is irrigated and removed from the pelvis.  The vesicouterine peritoneum is then incised in the midline, hydrodissected, and taken down cephalolaterally.  Adequate hemostasis is noted.  Instruments are removed and attention is turned to the vagina.  The posterior cul-de-sac is entered sharply without difficulty.  The cervix is circumscribed with a scalpel.  Numerous copious bleeders are encountered which are controlled with unipolar cautery.  The uterosacral ligament is then taken down bilaterally with the gyrus coagulation instrument.  Bladder pillars are then taken.  After entering the anterior cul-de-sac without difficulty, the cardinal ligaments followed by the uterine vessels are taken down bilaterally.  One or two bites above the  level of the uterine vessels is taken bilaterally.  The fundus is then delivered posteriorly.  Proximal ligaments are taken down and specimen is delivered.  Then using 0 Monocryl suture the cuff is closed, first with a suture placed in the 3 and 9 oclock positions to aid in hemostasis on the cuff itself.  The uterosacral ligaments are then plicated to the cuff bilaterally.  The uterosacral ligaments were then plicated in the midline with a single suture.  The cuff is then closed with figure-of-eight sutures which achieves good hemostasis.  A Foley catheter is placed in the bladder and clear urine is noted.  Attention is then returned to the abdomen.  Numerous small bleeders are encountered on the bladder flap.  These are controlled with superficial bipolar cautery.  Copious irrigation is carried out and all returns is clear.  The pneumoperitoneum is reduced and good hemostasis is still maintained.  Suprapubic trocar sleeve is removed and no bleeding is noted from there either.  Instruments are removed and the peritoneum is completely reduced and the umbilical trocar sleeve is removed.  A single 2-0 Vicryl suture is placed in the deeper underlying layers of the umbilical incision with care being taken not to pick up any underlying structures.  The incisions are then injected with 0.5% plain Marcaine.  Dermabond is then used to close the skin incisions.  All counts correct.  The patient is awakened and taken to the recovery room in stable condition. Dictated by:   Guy Sandifer Arleta Creek, M.D. Attending Physician:  Soledad Gerlach DD:  12/27/01 TD:  12/29/01 Job: [id number]

## 2010-10-31 NOTE — Op Note (Signed)
[facility] of [facility]  Patient:    Maria Stewart, Maria Stewart                       MRN: [id number] Proc. Date: 03/22/00 Adm. Date:  [id number] Attending:  Cordelia Pen Ii                           Operative Report  PREOPERATIVE DIAGNOSIS:       1. Intrauterine pregnancy at 28 weeks estimated                                  gestational age.                               2. Malpresentation.  POSTOPERATIVE DIAGNOSIS:      Oblique presentation.  OPERATION:                    Low transverse cesarean section with bilateral tubal ligation.  SURGEON:                      Guy Sandifer. Arleta Creek, M.D.  ANESTHESIA:                   Epidural, J. Linward Foster., M.D.  ESTIMATED BLOOD LOSS:         800 cc.  FINDINGS                      A viable female infant, Apgars 9/9 at 1 and 5 minutes, respectively.  Birth weight 8 pounds 2 ounces.  Arterial cord pH 7.29.  INDICATIONS AND CONSENT:      The patient is a 50 year old married white female, G3, P2, with an EDC of March 29, 2000.  Prenatal care was complicated by advanced maternal age with a normal 54 XY amniocentesis; history of macrosomia with a 10 pound 7 ounce infant with pregnancy #2 and glucola normal this pregnancy; history of positive group B strep.  The patient presents for induction of labor secondary to history of macrosomia.  At admission, the cervix was 2 cm, thick, ballotable vertex and initially unable to rupture membranes; however, the membranes began to leak and then to have a gush of clear fluid.  Pitocin was started.  Fetal heart tones remain reactive. The patient progressed to 4 cm.  Examination revealed a malpresentation.  It was felt that an elbow could be felt at the cervical os.  An ultrasound was done revealing probable oblique presentation.  Recommendation for immediate cesarean section was made.  Possible risks and complications were discussed with the patient and her husband, and all  questions were answered.  Consent was signed on the chart.  DESCRIPTION OF PROCEDURE:     The patient was taken to the operating room where epidural anesthetic is augmented to surgical level.  She is then placed in the dorsosupine position with a 15 degree left lateral wedge.  Foley catheter is then placed in the bladder.  The patient is prepped and draped in a sterile fashion.  After testing for adequate epidural anesthesia, skin is entered through a Pfannenstiel incision, and dissection is carried out in layers to the peritoneum.  The peritoneum is incised and extended superiorly and inferiorly.  Thus uterine and  peritoneum is taken down cephalolaterally. The bladder flap is developed and a bladder blade is placed. Uterus is incised in a low transverse manner.  Uterine cavity is entered bluntly with a Kelly clamp.  The uterine incision is extended cephalolaterally with the fingers. Inspection reveals the infant to be in the oblique presentation with the vertex in the right lower quadrant and the back down.  This is then converted to a frank breach presentation, and delivery is carried out without difficulty.  Good crying tone is noted.  Cord is clamped and cut, and the infant is handed to the awaiting pediatrics team.  Placenta is manually delivered and sent to pathology.  Internal uterine contour is normal.  The uterus is closed in a running locking fashion with 0 Monocryl suture which achieved good hemostasis.  Tubes and ovaries are normal bilaterally.  The left fallopian tube is identified from cornu to fimbria, grasped at its mid ampullary portion, and doubly ligated with two 0 plain free ties.  The intervening knuckle of tissue is the resected.  Unipolar cautery is used only enough to obtain complete hemostasis.  Similar procedure is carried out on the right side.  It should be noted that the tubal ligation was also discussed preoperatively including permanence, increased ectopic  risk, and failure rate. The anterior peritoneum is then closed in running fashion with 0 Monocryl suture which is also used to reapproximate the pyramidalis muscle in the midline.  Anterior rectus fascia is closed in running fashion with 0 PDS suture, and the skin is closed with clips.  All sponge, instrument, and needle counts are correct, and the patient is transferred to the recovery room in stable condition. DD:  03/22/00 TD:  03/23/00 Job: [id number]

## 2010-10-31 NOTE — Discharge Summary (Signed)
[facility]  Patient:    Maria Stewart, Maria Stewart                       MRN: [id number] Adm. Date:  [id number] Disc. Date: [id number] Attending:  Cordelia Pen Ii Dictator:   Danie Chandler, R.N.                           Discharge Summary  ADMITTING DIAGNOSES:          1. Intrauterine pregnancy at 45 weeks estimated                                  gestational age.                               2. For induction of labor due to history of                                  macrosomia.  DISCHARGE DIAGNOSES:          1. Intrauterine pregnancy at 60 weeks estimated                                  gestational age.                               2. For induction of labor due to history of                                  macrosomia.                               3. Malpresentation with oblique presentation.  PROCEDURE:                    On March 22, 2000, primary low transverse cesarean section with bilateral tubal ligation.  REASON FOR ADMISSION:         The patient is a 50 year old married white female, gravida 3, para 2, with an estimated date of confinement of March 29, 2000.  Prenatal care was complicated by advanced maternal age for which the patient underwent an amniocentesis which revealed a normal 46,XY chromosome.  The patient also has a history of macrosomia with a 10 pounds 7 ounce infant with pregnancy #2 and normal glucola this pregnancy.  The patient presented for induction of labor secondary to history of macrosomia.  At admission, the cervix was 2 cm, thick, ballottable, vertex, and initially unable to rupture membranes.  The membranes did begin to leak and then to have a gush of clear fluid.  Pitocin was started.  Fetal heart tones remained reactive.  The patient progressed to 4 cm.  Examination revealed a malpresentation.  It was felt that an elbow could be felt at the cervical os. An ultrasound was done revealing probable oblique  presentation. Recommendation for immediate cesarean section was made.  HOSPITAL COURSE:  The patient was taken to the operating room and underwent the above-named procedure without complications.  This was productive of a viable female infant with Apgars of 9 at one minute and 9 at five minutes and an arterial cord pH of 7.29.  Postoperatively on day #1, the patients hemoglobin was 10.1, hematocrit 27.9, and white blood cell count 16.3.  The patient had good control of pain on this day.  On postoperative day #2, the patient was tolerating a regular diet and ambulating well without difficulty.  She also had a good return of bowel function.  She was discharged home on postoperative day #3.  CONDITION ON DISCHARGE:       Good.  DIET:                         Regular as tolerated.  ACTIVITY:                     No heavy lifting.  No driving.  No vaginal entry.  FOLLOW-UP:                    She is to follow up in the office in 1-2 weeks for an incision check, and she is to call for a temperature greater than 100 degrees, persistent nausea, vomiting, heavy vaginal bleeding, and/or redness or drainage from the incision site.  DISCHARGE MEDICATIONS:        1. Prenatal vitamin 1 p.o. q.d.                               2. Vicodin as directed by M.D. DD:  03/25/00 TD:  03/27/00 Job: [id number]

## 2011-04-16 HISTORY — PX: NASAL SINUS SURGERY: SHX719

## 2011-04-24 ENCOUNTER — Other Ambulatory Visit: Payer: Self-pay | Admitting: Obstetrics and Gynecology

## 2011-04-24 DIAGNOSIS — Z1231 Encounter for screening mammogram for malignant neoplasm of breast: Secondary | ICD-10-CM

## 2011-05-29 ENCOUNTER — Ambulatory Visit
Admission: RE | Admit: 2011-05-29 | Discharge: 2011-05-29 | Disposition: A | Discharge status: Home / Self Care | Payer: BC Managed Care – PPO | Source: Ambulatory Visit | Attending: Obstetrics and Gynecology | Admitting: Obstetrics and Gynecology

## 2011-05-29 DIAGNOSIS — Z1231 Encounter for screening mammogram for malignant neoplasm of breast: Secondary | ICD-10-CM

## 2011-05-29 IMAGING — MG MM DIGITAL SCREENING BILAT
4 series · 4 of 4 positions shown · non-contrast
Comparison: none

DG SCREEN MAMMOGRAM BILATERAL
Bilateral CC and MLO view(s) were taken.
Technologist: JULITA

DIGITAL SCREENING MAMMOGRAM WITH CAD:
There are scattered fibroglandular densities.  Post-reduction scars bilaterally.  No masses or 
malignant type calcifications are identified.  Compared with prior studies.
Images were processed with CAD.

[R CC]
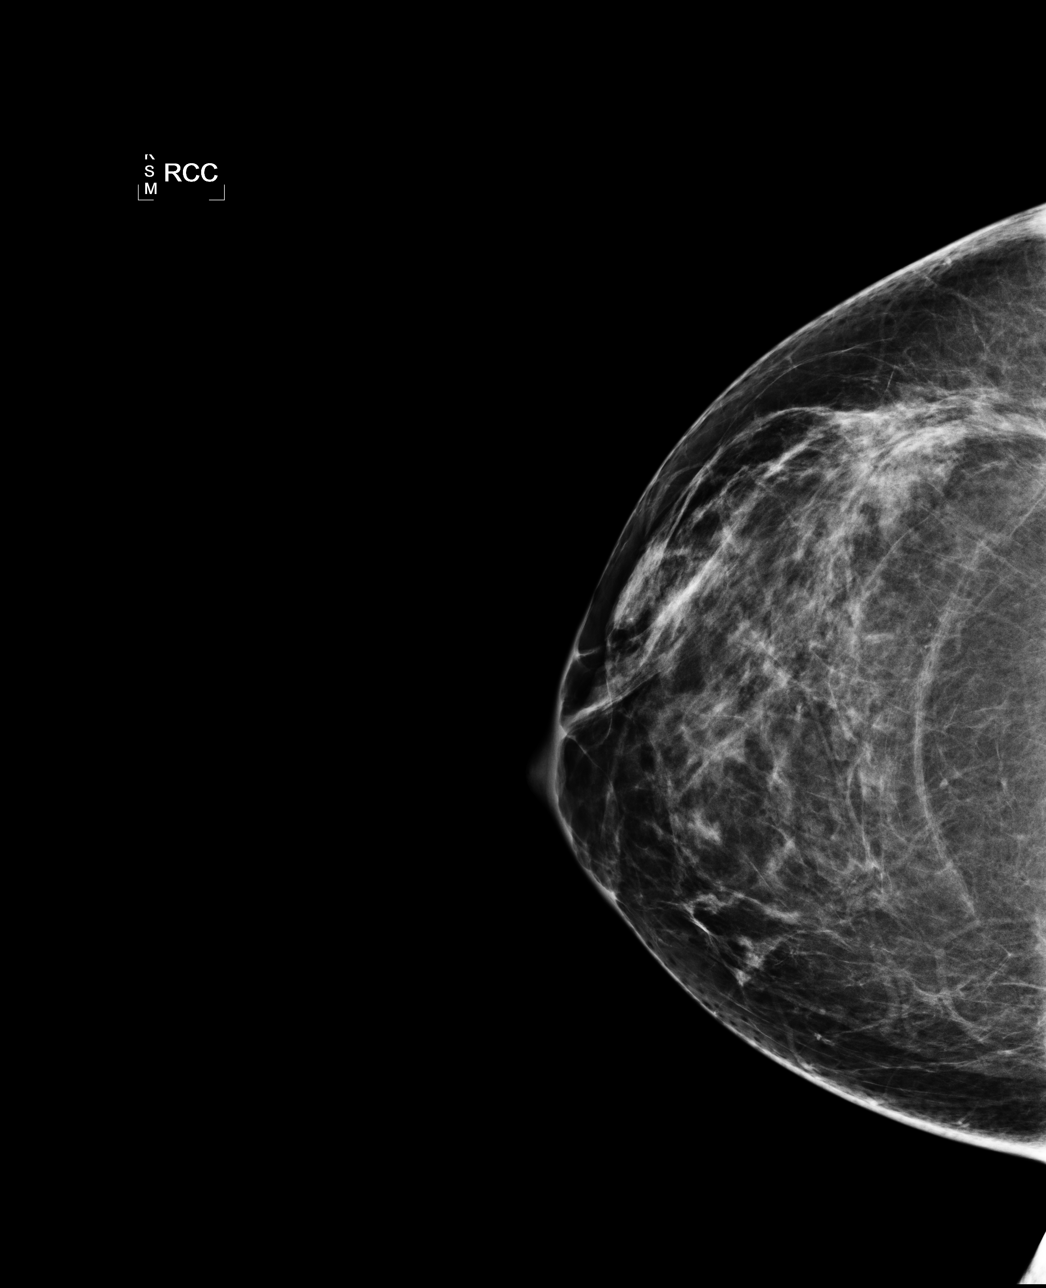

[L CC]
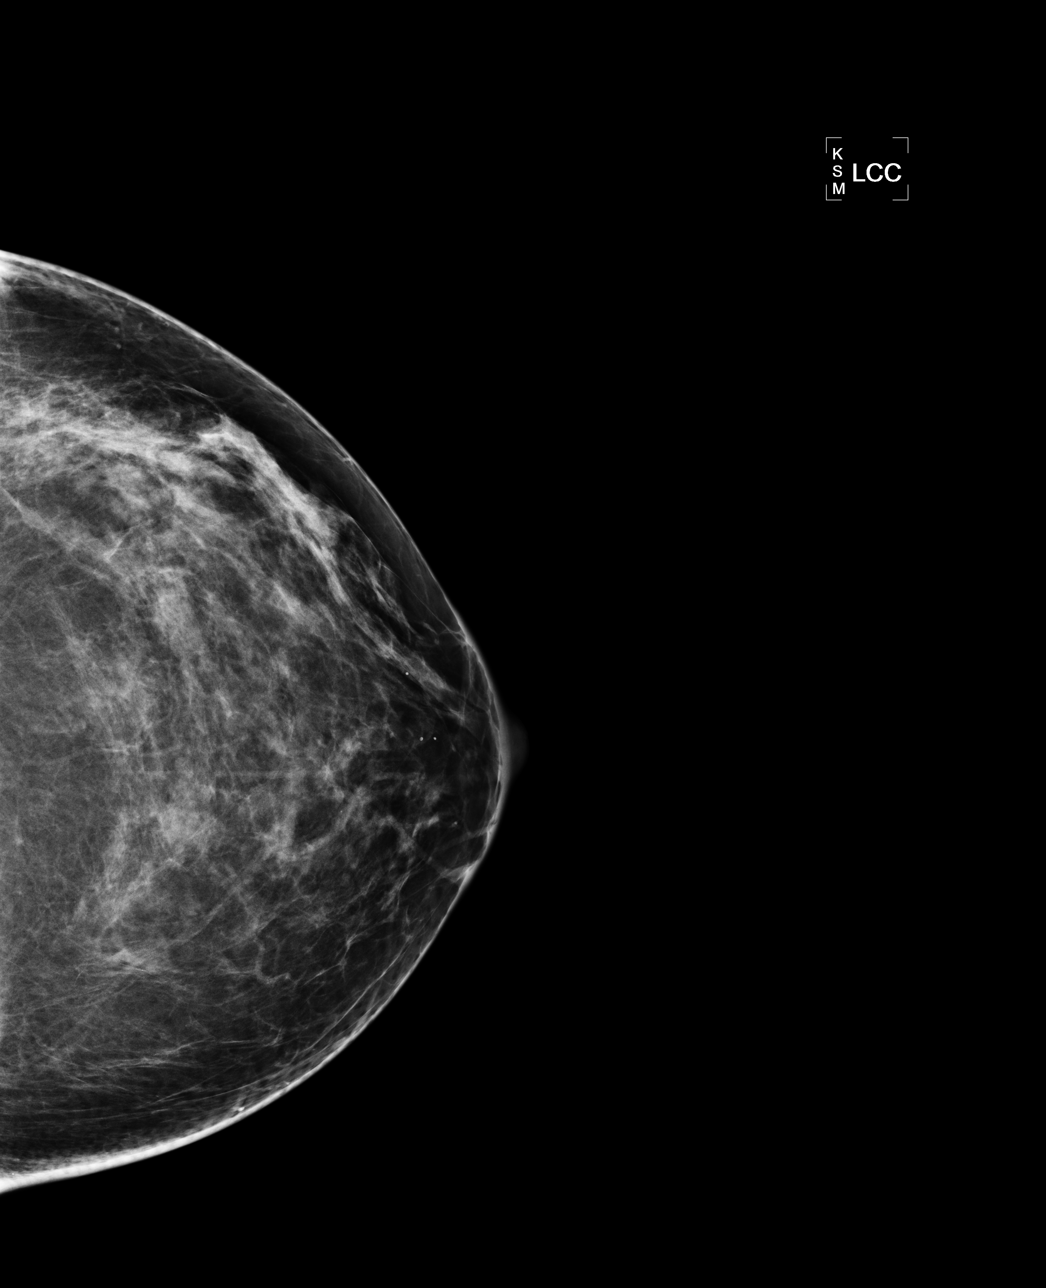

[L MLO]
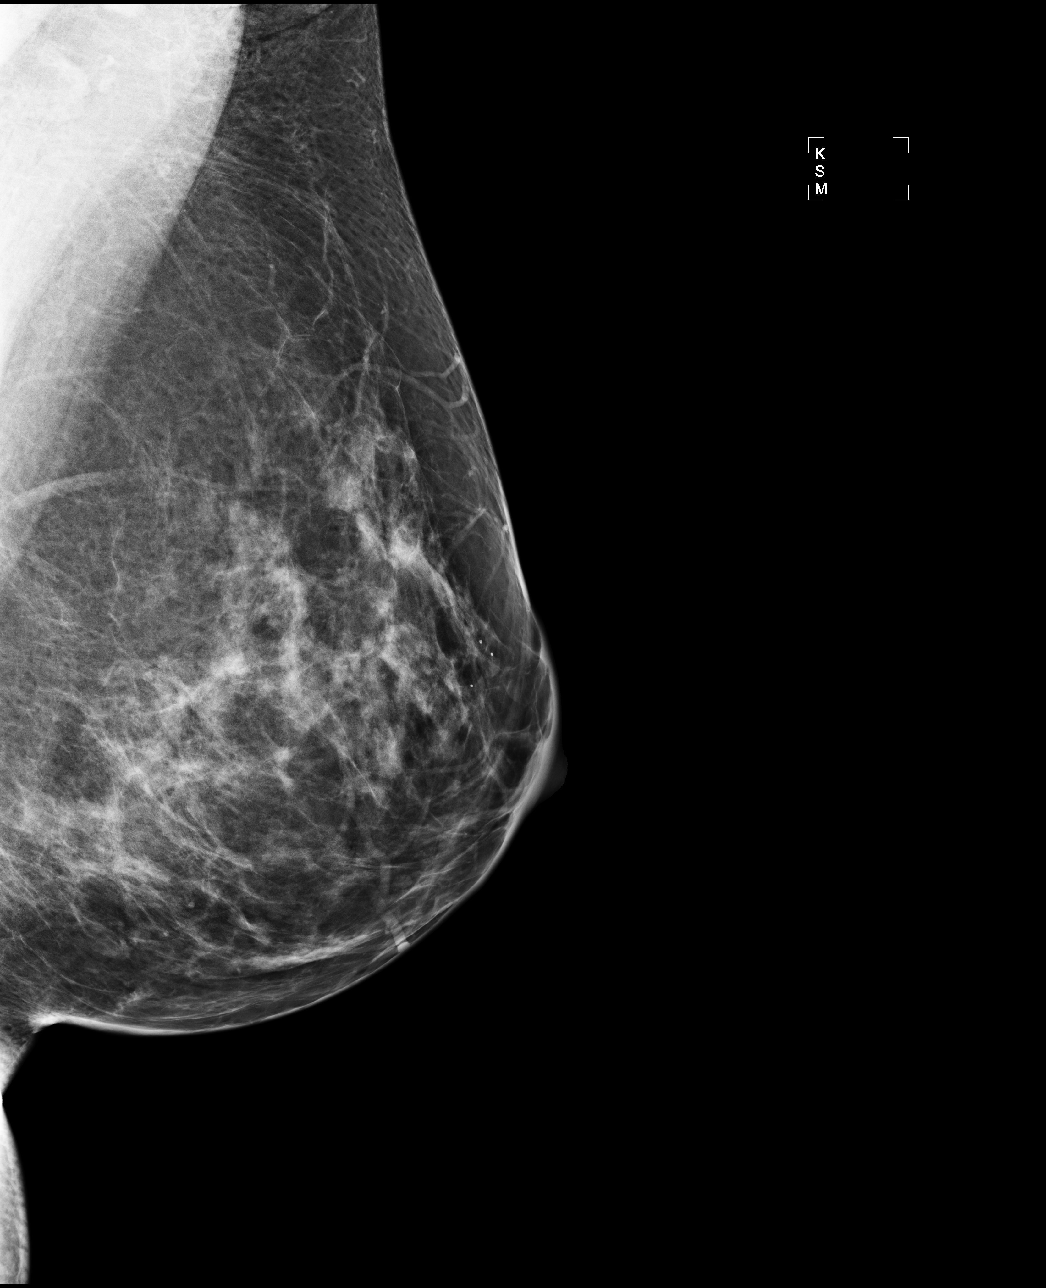

[R MLO]
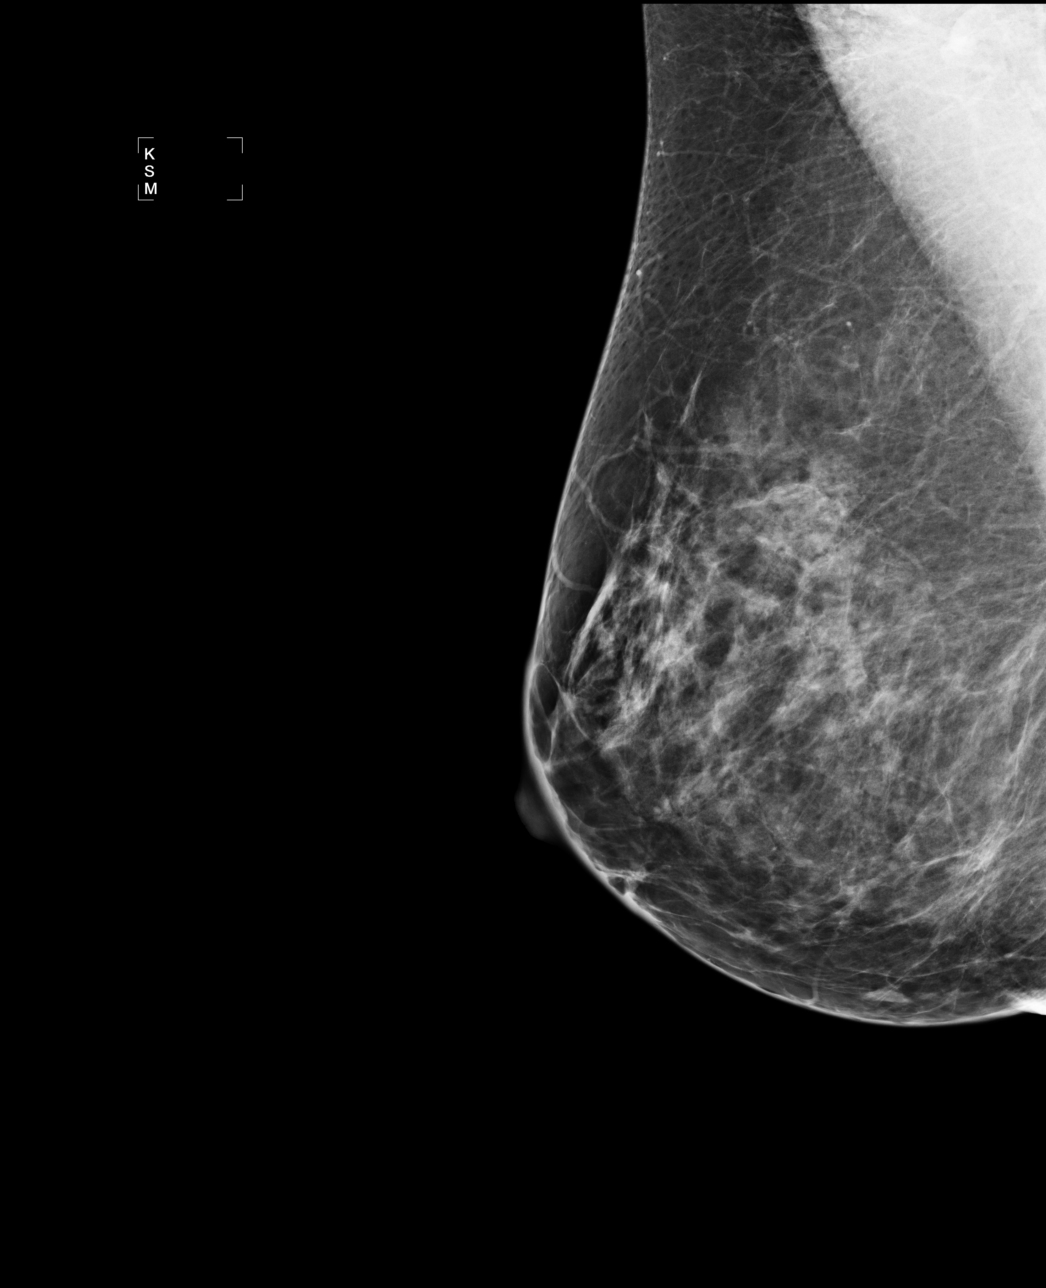

[4 of 4 positions shown; findings below may reference images not displayed]

IMPRESSION: No specific mammographic evidence of malignancy.  Next screening mammogram is recommended in one 
year.

A result letter of this screening mammogram will be mailed directly to the patient.

ASSESSMENT: Negative - BI-RADS 1

Screening mammogram in 1 year.
,

## 2012-03-25 ENCOUNTER — Encounter: Payer: Self-pay | Admitting: Pulmonary Disease

## 2012-03-25 ENCOUNTER — Encounter: Payer: Self-pay | Admitting: *Deleted

## 2012-03-28 ENCOUNTER — Ambulatory Visit (INDEPENDENT_AMBULATORY_CARE_PROVIDER_SITE_OTHER): Payer: BC Managed Care – PPO | Admitting: Pulmonary Disease

## 2012-03-28 ENCOUNTER — Encounter: Payer: Self-pay | Admitting: Pulmonary Disease

## 2012-03-28 VITALS — BP 142/98 | HR 96 | Temp 98.8°F | Ht 65.0 in | Wt 187.0 lb

## 2012-03-28 DIAGNOSIS — Z23 Encounter for immunization: Secondary | ICD-10-CM

## 2012-03-28 DIAGNOSIS — G4733 Obstructive sleep apnea (adult) (pediatric): Secondary | ICD-10-CM

## 2012-03-28 NOTE — Progress Notes (Signed)
  Subjective:    Patient ID: Maria Stewart, female    DOB: 1960-11-18, 51 y.o.   MRN: [id number]  HPI The patient is a 51 year old female who I've been asked to see for possible obstructive sleep apnea.  She has been noted to have loud snoring by her family, as well as an abnormal breathing pattern during sleep.  She actually notes a gasp at times whenever she is first falling asleep.  She denies frequent awakenings at night, but is not rested in the mornings upon arising.  She notes definite sleep pressured during the day with periods of inactivity, and sometimes can take a prolonged afternoon nap.  She tells me that she has "brain fog" during the day.  She seems to get her second wind in the evening, and has no issues watching television with her family.  She denies any sleepiness with driving short distances, but has definite sleep pressure with longer distances.  She states that her weight is up 20 pounds over the last 2 years, and her Epworth score today is 4.  She also notes that she had a sleep study about 8 years ago that was unremarkable.  Sleep Questionnaire: What time do you typically go to bed?( Between what hours) 11pm--1am How long does it take you to fall asleep? 10 mins How many times during the night do you wake up? 0 What time do you get out of bed to start your day? 0700 Do you drive or operate heavy machinery in your occupation? No How much has your weight changed (up or down) over the past two years? (In pounds) 20 lb (9.072 kg) Have you ever had a sleep study before? Yes If yes, location of study? Pt states location was near [facility] If yes, date of study? 8 years ago?? Do you currently use CPAP? No Do you wear oxygen at any time? No    Review of Systems  Constitutional: Negative for fever and unexpected weight change.  HENT: Positive for congestion and sinus pressure. Negative for ear pain, nosebleeds, sore throat, rhinorrhea, sneezing, trouble swallowing, dental problem  and postnasal drip.   Eyes: Negative for redness and itching.  Respiratory: Negative for cough, chest tightness, shortness of breath and wheezing.   Cardiovascular: Negative for palpitations and leg swelling.  Gastrointestinal: Negative for nausea and vomiting.  Genitourinary: Negative for dysuria.  Musculoskeletal: Positive for joint swelling and arthralgias.  Skin: Negative for rash.  Neurological: Positive for headaches.  Hematological: Does not bruise/bleed easily.  Psychiatric/Behavioral: Negative for dysphoric mood. The patient is not nervous/anxious.        Objective:   Physical Exam Constitutional:  Overweight female, no acute distress  HENT:  Nares patent without discharge  Oropharynx without exudate, palate and uvula are elongated  Eyes:  Perrla, eomi, no scleral icterus  Neck:  No JVD, no TMG  Cardiovascular:  Normal rate, regular rhythm, no rubs or gallops.  No murmurs        Intact distal pulses  Pulmonary :  Normal breath sounds, no stridor or respiratory distress   No rales, rhonchi, or wheezing  Abdominal:  Soft, nondistended, bowel sounds present.  No tenderness noted.   Musculoskeletal:  No lower extremity edema noted.  Lymph Nodes:  No cervical lymphadenopathy noted  Skin:  No cyanosis noted  Neurologic:  Alert, appropriate, moves all 4 extremities without obvious deficit.         Assessment & Plan:

## 2012-03-28 NOTE — Patient Instructions (Addendum)
Will schedule for home sleep testing.  Will arrange followup once results are available.  

## 2012-03-28 NOTE — Assessment & Plan Note (Signed)
The patient's history is very suspicious for clinically significant sleep disordered breathing.  I've had a long discussion with her about the pathophysiology of sleep apnea, including its impact to her quality of life and cardiovascular health.  At this point, I think she needs a sleep study, and would be an excellent candidate for home sleep testing.  Will arrange followup once the results are available.

## 2012-03-29 DIAGNOSIS — Z23 Encounter for immunization: Secondary | ICD-10-CM

## 2012-04-01 ENCOUNTER — Telehealth: Payer: Self-pay | Admitting: Pulmonary Disease

## 2012-04-01 ENCOUNTER — Other Ambulatory Visit: Payer: Self-pay | Admitting: Pulmonary Disease

## 2012-04-01 DIAGNOSIS — G4733 Obstructive sleep apnea (adult) (pediatric): Secondary | ICD-10-CM

## 2012-04-01 NOTE — Telephone Encounter (Signed)
Called and spoke with patient to advise her that [employer] has denied the home sleep study. Patient stated that she would like to proceed to the sleep study done at the sleep lab. Pt wants a Monday or Tuesday night. Please send order to [facility] for in lab sleep study.[name]

## 2012-04-05 NOTE — Telephone Encounter (Signed)
Order was sent by [facility] on 04/01/12, will sign off message

## 2012-04-26 ENCOUNTER — Ambulatory Visit (HOSPITAL_BASED_OUTPATIENT_CLINIC_OR_DEPARTMENT_OTHER): Payer: BC Managed Care – PPO | Attending: Pulmonary Disease | Admitting: Radiology

## 2012-04-26 VITALS — Ht 65.0 in | Wt 187.0 lb

## 2012-04-26 DIAGNOSIS — R0989 Other specified symptoms and signs involving the circulatory and respiratory systems: Secondary | ICD-10-CM | POA: Insufficient documentation

## 2012-04-26 DIAGNOSIS — G4733 Obstructive sleep apnea (adult) (pediatric): Secondary | ICD-10-CM

## 2012-04-26 DIAGNOSIS — R0609 Other forms of dyspnea: Secondary | ICD-10-CM | POA: Insufficient documentation

## 2012-04-26 DIAGNOSIS — E663 Overweight: Secondary | ICD-10-CM | POA: Insufficient documentation

## 2012-04-27 ENCOUNTER — Other Ambulatory Visit: Payer: Self-pay | Admitting: Obstetrics and Gynecology

## 2012-04-27 DIAGNOSIS — Z1231 Encounter for screening mammogram for malignant neoplasm of breast: Secondary | ICD-10-CM

## 2012-04-27 DIAGNOSIS — Z9889 Other specified postprocedural states: Secondary | ICD-10-CM

## 2012-05-10 ENCOUNTER — Telehealth: Payer: Self-pay | Admitting: Pulmonary Disease

## 2012-05-10 DIAGNOSIS — G4710 Hypersomnia, unspecified: Secondary | ICD-10-CM

## 2012-05-10 DIAGNOSIS — G4730 Sleep apnea, unspecified: Secondary | ICD-10-CM

## 2012-05-10 NOTE — Telephone Encounter (Signed)
Kim called pt to schedule a follow-up to discuss sleep results with KC. Pt needed to check her schedule and said she would call back. We were looking at openings on Dec 4th. Will await call back from pt.

## 2012-05-10 NOTE — Procedures (Signed)
NAME:  Maria Stewart, Maria Stewart NO.:  [id number]  MEDICAL RECORD NO.:  [id number]          PATIENT TYPE:  OUT  LOCATION:  SLEEP CENTER                 FACILITY:  [facility]  PHYSICIAN:  Barbaraann Share, MD,FCCPDATE OF BIRTH:  01/25/1962  DATE OF STUDY:  04/26/2012                           NOCTURNAL POLYSOMNOGRAM  REFERRING PHYSICIAN:  Barbaraann Share, MD,FCCP  REFERRING PHYSICIAN:  Barbaraann Share, MD,FCCP  INDICATION FOR STUDY:  Hypersomnia with sleep apnea.  EPWORTH SLEEPINESS SCORE:  5.  SLEEP ARCHITECTURE:  The patient had a total sleep time of 326 minutes with very little slow-wave sleep and only 84 minutes of REM.  Sleep onset latency was prolonged at 40 minutes and REM onset was normal at 96 minutes.  Sleep efficiency was mildly reduced at 81%.  RESPIRATORY DATA:  The patient was found to have 19 obstructive apneas and 44 obstructive hypopneas, giving her an apnea/hypopnea index of 12 events per hour.  The events occurred in all body positions and there was loud snoring noted throughout.  OXYGEN DATA:  There was O2 desaturation as low as 87% with the patient's obstructive events.  CARDIAC DATA:  No clinically significant arrhythmias were noted.  MOVEMENT/PARASOMNIA:  The patient had no significant leg jerks or other abnormal behavior seen.  IMPRESSION/RECOMMENDATION:  Mild obstructive sleep apnea/hypopnea syndrome, with an AHI of 12 events per hour and oxygen desaturation as low as 87%.  Treatment for this degree of sleep apnea can include a trial of weight loss alone, upper airway surgery, dental appliance, and also CPAP.  Clinical correlation is suggested.     Barbaraann Share, MD,FCCP Diplomate, American Board of Sleep Medicine    [facility]/MED[facility]  D:  05/10/2012 08:34:32  T:  05/10/2012 10:38:58  Job:  [id number]

## 2012-05-16 NOTE — Telephone Encounter (Addendum)
Pt returned call. I have scheduled her for a f/u with [facility] 05-27-12. Maria Stewart

## 2012-05-27 ENCOUNTER — Encounter: Payer: Self-pay | Admitting: Pulmonary Disease

## 2012-05-27 ENCOUNTER — Ambulatory Visit (INDEPENDENT_AMBULATORY_CARE_PROVIDER_SITE_OTHER): Payer: BC Managed Care – PPO | Admitting: Pulmonary Disease

## 2012-05-27 VITALS — BP 122/88 | HR 87 | Temp 97.9°F | Ht 65.0 in | Wt 190.2 lb

## 2012-05-27 DIAGNOSIS — G4733 Obstructive sleep apnea (adult) (pediatric): Secondary | ICD-10-CM

## 2012-05-27 NOTE — Progress Notes (Signed)
  Subjective:    Patient ID: Rolm Bookbinder, female    DOB: 06/15/1961, 51 y.o.   MRN: [id number]  HPI The patient comes in today for followup of her recent sleep study.  She was found to have mild obstructive sleep apnea, with an AHI of 12 events per hour.  I have reviewed the study with her in detail, and answered all of her questions.   Review of Systems  Constitutional: Negative for fever and unexpected weight change.  HENT: Positive for congestion. Negative for ear pain, nosebleeds, sore throat, rhinorrhea, sneezing, trouble swallowing, dental problem, postnasal drip and sinus pressure.   Eyes: Negative for redness and itching.  Respiratory: Negative for cough, chest tightness, shortness of breath and wheezing.   Cardiovascular: Negative for palpitations and leg swelling.  Gastrointestinal: Negative for nausea and vomiting.  Genitourinary: Negative for dysuria.  Musculoskeletal: Negative for joint swelling.  Skin: Negative for rash.  Neurological: Negative for headaches.  Hematological: Does not bruise/bleed easily.  Psychiatric/Behavioral: Negative for dysphoric mood. The patient is not nervous/anxious.        Objective:   Physical Exam Overweight female in no acute distress Nose without purulence or discharge noted Neck without lymphadenopathy or thyromegaly Lower extremities without edema, cyanosis Alert and oriented, moves all 4 extremities.       Assessment & Plan:

## 2012-05-27 NOTE — Assessment & Plan Note (Signed)
The patient has been found to have mild obstructive sleep apnea, however it is greatly impacting her quality of life.  I have discussed with her the various treatment options, including a trial of weight loss alone, upper airway surgery, dental appliance, and also CPAP.  Because of its impact to her life, she would like to treat this as aggressively as possible.  She really does not have abnormal upper airway anatomy that is amenable to surgery, and she has a history of TMJ and therefore a dental appliance may be an issue.  I have discussed with her trying CPAP, and she is agreeable. I will set the patient up on cpap at a moderate pressure level to allow for desensitization, and will troubleshoot the device over the next 4-6weeks if needed.  The pt is to call me if having issues with tolerance.  Will then optimize the pressure once patient is able to wear cpap on a consistent basis.

## 2012-05-27 NOTE — Patient Instructions (Addendum)
Will start on cpap.  Please call if you have issues with tolerance Work on weight reduction followup with me in 6 weeks.

## 2012-06-10 ENCOUNTER — Ambulatory Visit
Admission: RE | Admit: 2012-06-10 | Discharge: 2012-06-10 | Disposition: A | Discharge status: Home / Self Care | Payer: BC Managed Care – PPO | Source: Ambulatory Visit | Attending: Obstetrics and Gynecology | Admitting: Obstetrics and Gynecology

## 2012-06-10 DIAGNOSIS — Z9889 Other specified postprocedural states: Secondary | ICD-10-CM

## 2012-06-10 DIAGNOSIS — Z1231 Encounter for screening mammogram for malignant neoplasm of breast: Secondary | ICD-10-CM

## 2012-06-10 IMAGING — MG MM DIGITAL SCREENING BILAT W/ CAD
8 series · 8 of 24 positions shown · non-contrast
Comparison: Previous exams.

CLINICAL DATA: Screening.

DIGITAL BILATERAL SCREENING MAMMOGRAM WITH CAD
DIGITAL BREAST TOMOSYNTHESIS
Digital breast tomosynthesis images are acquired in two
projections.  These images are reviewed in combination with the
digital mammogram, confirming the findings below.

[R CC]
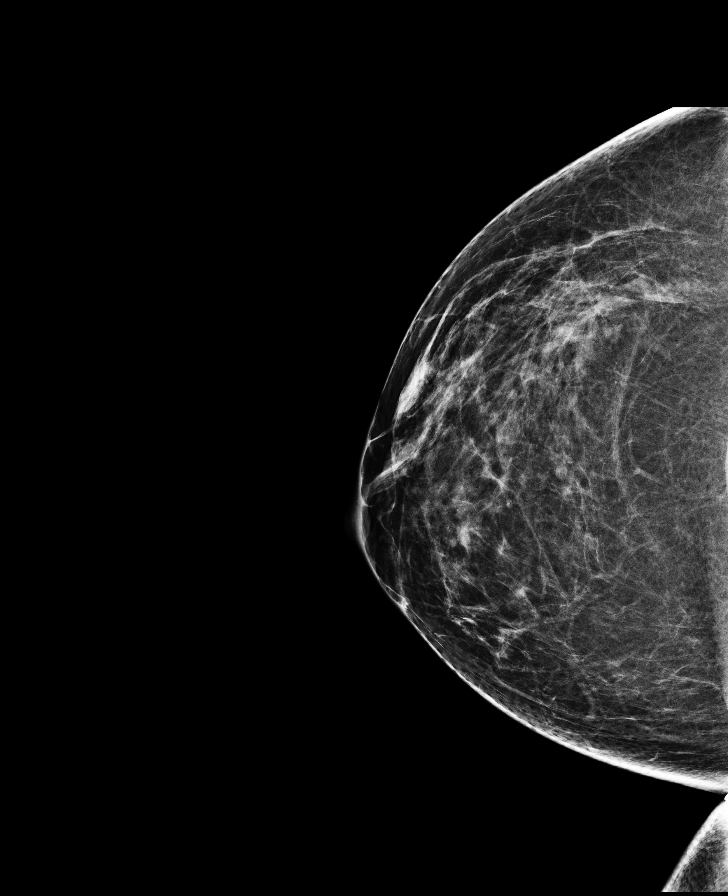

[R MLO]
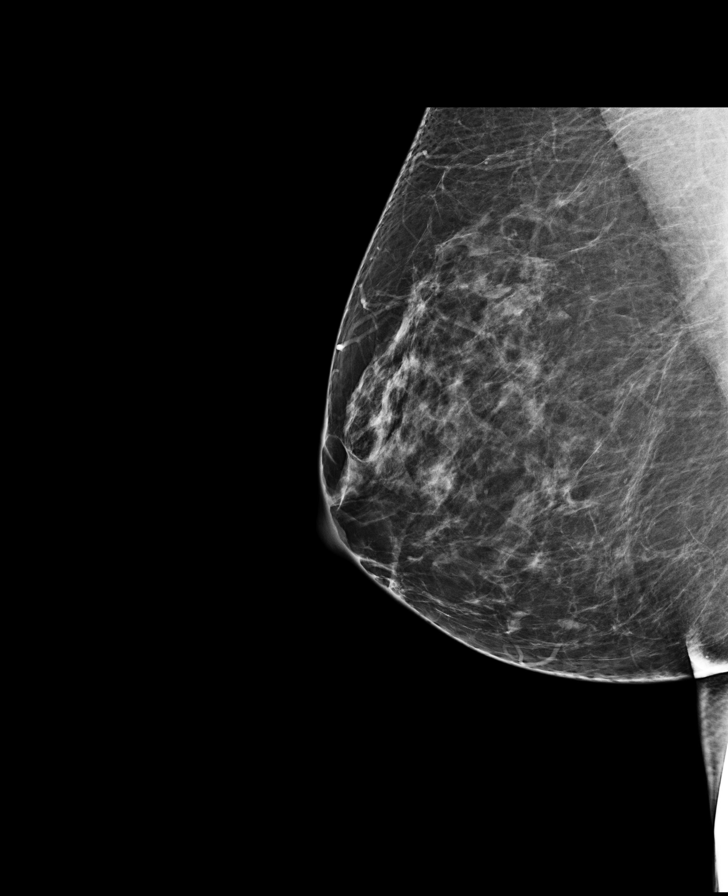

[L MLO]
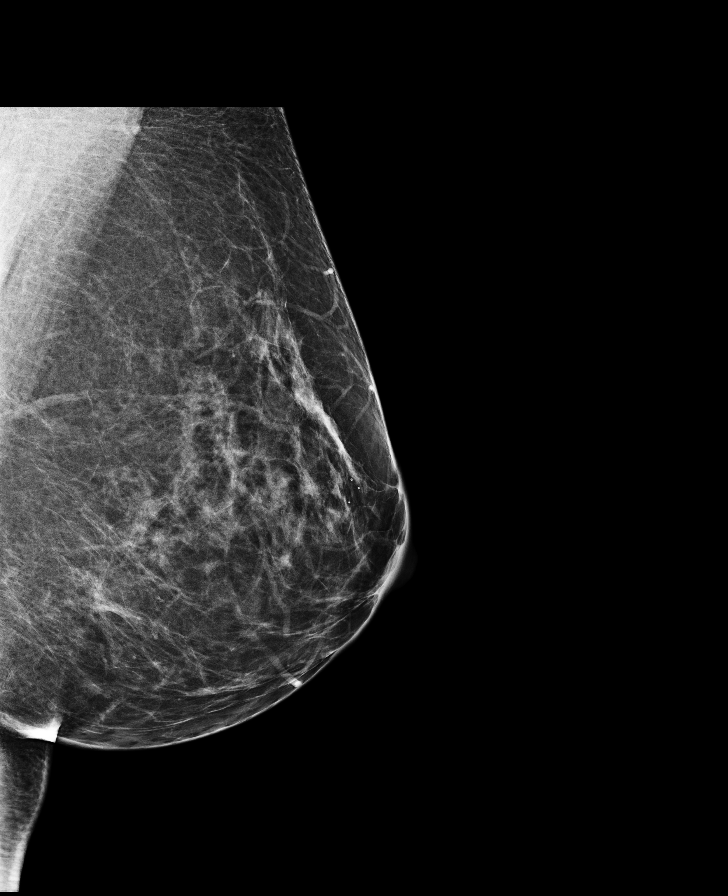

[L CC]
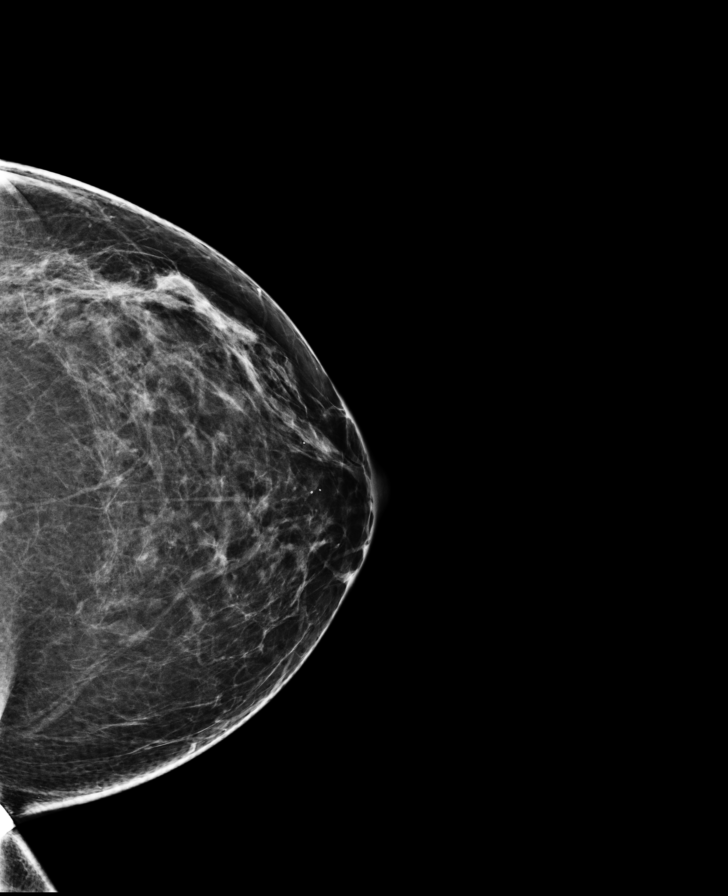

[L MLO tomo · tomo slice 43/85.0]
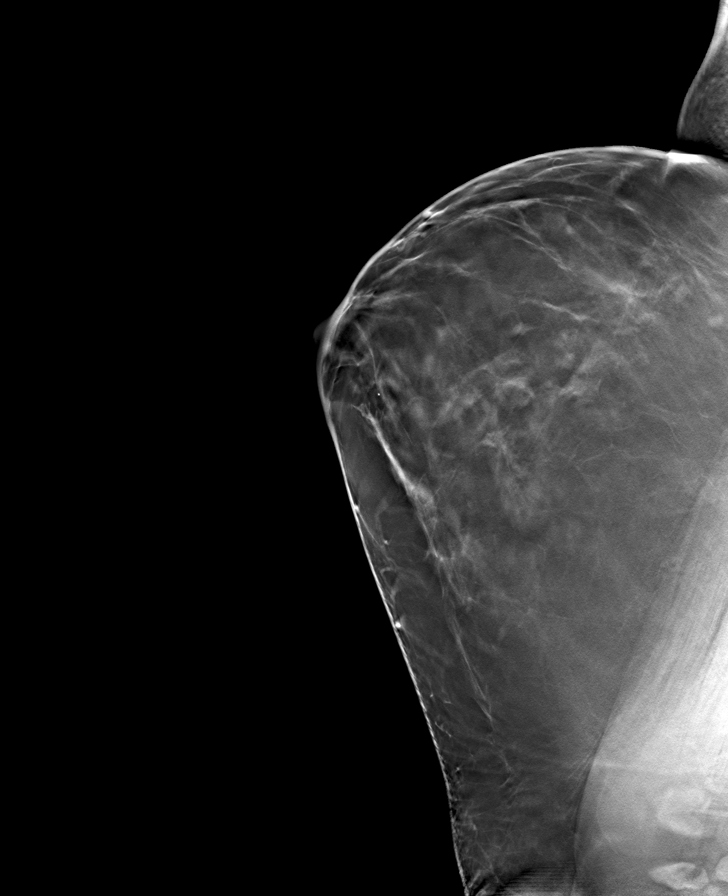

[L CC tomo · tomo slice 43/84.0]
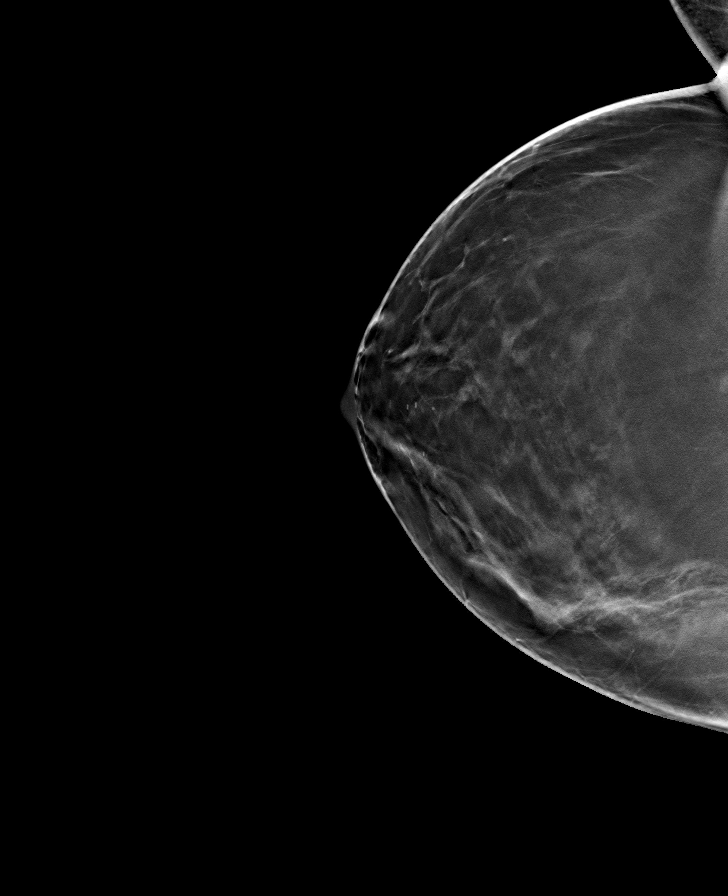

[R MLO tomo · tomo slice 41/82.0]
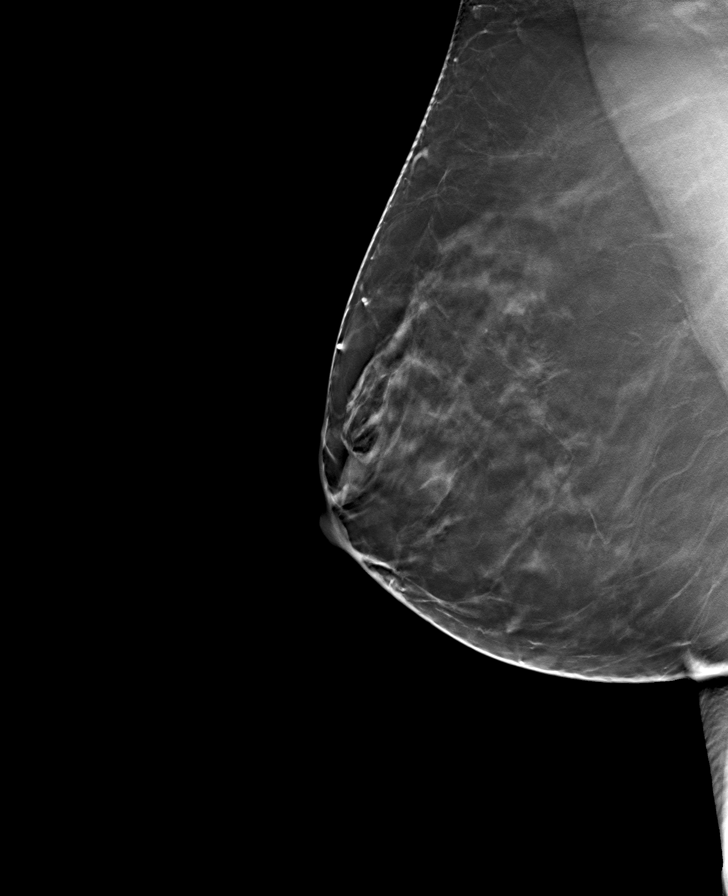

[R CC tomo · tomo slice 43/85.0]
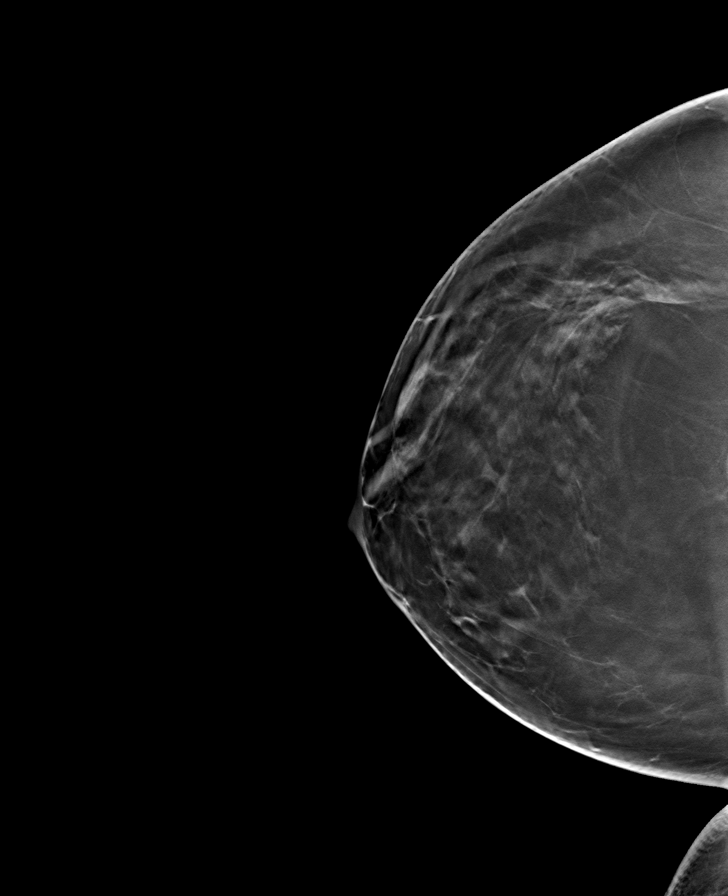

[8 of 24 positions shown; findings below may reference images not displayed]

FINDINGS: ACR Breast Density Category 2: There is a scattered fibroglandular
pattern.

No suspicious masses, architectural distortion, or calcifications
are present.

Images were processed with CAD.
IMPRESSION: No mammographic evidence of malignancy.

A result letter of this screening mammogram will be mailed directly
to the patient.

RECOMMENDATION:
Screening mammogram in one year. (Code:[OK])

BI-RADS CATEGORY 1:  Negative.

## 2012-07-08 ENCOUNTER — Ambulatory Visit: Payer: BC Managed Care – PPO | Admitting: Pulmonary Disease

## 2012-07-14 ENCOUNTER — Ambulatory Visit: Payer: BC Managed Care – PPO | Admitting: Pulmonary Disease

## 2012-07-21 ENCOUNTER — Encounter: Payer: Self-pay | Admitting: Pulmonary Disease

## 2012-07-21 ENCOUNTER — Ambulatory Visit (INDEPENDENT_AMBULATORY_CARE_PROVIDER_SITE_OTHER): Payer: BC Managed Care – PPO | Admitting: Pulmonary Disease

## 2012-07-21 VITALS — BP 122/80 | HR 90 | Temp 98.3°F | Ht 65.0 in | Wt 194.2 lb

## 2012-07-21 DIAGNOSIS — G4733 Obstructive sleep apnea (adult) (pediatric): Secondary | ICD-10-CM

## 2012-07-21 NOTE — Assessment & Plan Note (Signed)
The patient overall is doing very well with CPAP, and feels that it has benefited her.  She is having some mild skin irritation over the bridge of her nose, but this is getting better.  She wants to continue working with this mask, but we'll try something different if she continues to have issues.  We also need to optimize her pressure, and we'll do this on the automatic setting. Care Plan:  At this point, will arrange for the patient's machine to be changed over to auto mode for 2 weeks to optimize their pressure.  I will review the downloaded data once sent by dme, and also evaluate for compliance, leaks, and residual osa.  I will call the patient and dme to discuss the results, and have the patient's machine set appropriately.  This will serve as the pt's cpap pressure titration.

## 2012-07-21 NOTE — Progress Notes (Signed)
  Subjective:    Patient ID: Maria Stewart, female    DOB: 20-Dec-1960, 52 y.o.   MRN: [id number]  HPI The patient comes in today for followup of her obstructive sleep apnea.  She has been wearing CPAP compliantly, and feels that it has helped her sleep and daytime alertness.  She has had no issues with her pressure, but is having some mild skin breakdown on the bridge of her nose.  She has put a Band-Aid over this area, and it is healing up slowly.  She is happy with the mask seal, and will therefore continue working with this.   Review of Systems  Constitutional: Negative for fever and unexpected weight change.  HENT: Negative for ear pain, nosebleeds, congestion, sore throat, rhinorrhea, sneezing, trouble swallowing, dental problem, postnasal drip and sinus pressure.   Eyes: Negative for redness and itching.  Respiratory: Negative for cough, chest tightness, shortness of breath and wheezing.   Cardiovascular: Negative for palpitations and leg swelling.  Gastrointestinal: Negative for nausea and vomiting.  Genitourinary: Negative for dysuria.  Musculoskeletal: Negative for joint swelling.  Skin: Negative for rash.  Neurological: Negative for headaches.  Hematological: Does not bruise/bleed easily.  Psychiatric/Behavioral: Negative for dysphoric mood. The patient is not nervous/anxious.        Objective:   Physical Exam Overweight female in no acute distress Nose without purulence or discharge noted No skin breakdown or pressure necrosis from the CPAP mask Neck without lymphadenopathy or thyromegaly Lower extremities without edema, no cyanosis Alert and oriented, moves all 4 extremities.         Assessment & Plan:

## 2012-07-21 NOTE — Patient Instructions (Addendum)
Will optimize your pressure on the automatic mode for the next few weeks, and will let you know your pressure once we get the download Work on weight loss Please call if you continue to have mask issues. followup with me in 6mos.

## 2012-10-06 ENCOUNTER — Telehealth: Payer: Self-pay | Admitting: Pulmonary Disease

## 2012-10-06 NOTE — Telephone Encounter (Signed)
I spoke with the pt and she states that she has not heard anything about her download that was done about 6 weeks ago. I advised I will check with [facility] nurse to see if they have the download and if not we will requests the results. Ashtyn do you have this download? Please advise. Carron Curie, CMA

## 2012-10-06 NOTE — Telephone Encounter (Signed)
D/L has been received. Will give to [facility] in the morning to review.  Please advise Dr Clance--pt calling requesting results. Thanks.

## 2012-10-07 ENCOUNTER — Other Ambulatory Visit: Payer: Self-pay | Admitting: Pulmonary Disease

## 2012-10-07 DIAGNOSIS — G4733 Obstructive sleep apnea (adult) (pediatric): Secondary | ICD-10-CM

## 2012-10-07 NOTE — Telephone Encounter (Signed)
I spoke with pt and is aware of settings. Nothing further was needed

## 2012-10-07 NOTE — Telephone Encounter (Signed)
Please let pt know that 12-13 is her optimal pressure.  Will send an order for them to try you on 12cm.

## 2013-01-30 ENCOUNTER — Ambulatory Visit: Payer: BC Managed Care – PPO | Admitting: Pulmonary Disease

## 2013-02-28 ENCOUNTER — Ambulatory Visit (INDEPENDENT_AMBULATORY_CARE_PROVIDER_SITE_OTHER): Payer: BC Managed Care – PPO | Admitting: Pulmonary Disease

## 2013-02-28 ENCOUNTER — Encounter: Payer: Self-pay | Admitting: Pulmonary Disease

## 2013-02-28 VITALS — BP 100/70 | HR 87 | Temp 98.5°F | Ht 65.0 in | Wt 181.6 lb

## 2013-02-28 DIAGNOSIS — G4733 Obstructive sleep apnea (adult) (pediatric): Secondary | ICD-10-CM

## 2013-02-28 NOTE — Assessment & Plan Note (Signed)
The pt is wearing cpap 4-5 nights a week and does fairly well once she gets to sleep.  However, 2-3 nights a week she is unable to get to sleep with the device.  This is simply that she is "uncomfortable while in bed" wearing the device.  I have discussed with her again the possibility of a dental appliance.  She is going to talk with her husband, who is a Education officer, community, about this again.  She will let me know what she decides.

## 2013-02-28 NOTE — Progress Notes (Signed)
  Subjective:    Patient ID: Maria Stewart, female    DOB: [birth year], 52 y.o.   MRN: [id number]  HPI The patient comes in today for followup of her obstructive sleep apnea.  She is wearing CPAP 5 nights out of 7, and the other night she is unable to get comfortable with it.  This is not an issue with the mask fit, the device, or pressure, but just her ability to get comfortable in bed while wearing the device.  When she does get to sleep, she sleeps very well with the device, and sees a difference the next day and her alertness and energy level.   Review of Systems  Constitutional: Negative for fever and unexpected weight change.  HENT: Negative for ear pain, nosebleeds, congestion, sore throat, rhinorrhea, sneezing, trouble swallowing, dental problem, postnasal drip and sinus pressure.   Eyes: Negative for redness and itching.  Respiratory: Negative for cough, chest tightness, shortness of breath and wheezing.   Cardiovascular: Negative for palpitations and leg swelling.  Gastrointestinal: Negative for nausea and vomiting.  Genitourinary: Negative for dysuria.  Musculoskeletal: Negative for joint swelling.  Skin: Negative for rash.  Neurological: Negative for headaches.  Hematological: Does not bruise/bleed easily.  Psychiatric/Behavioral: Negative for dysphoric mood. The patient is not nervous/anxious.        Objective:   Physical Exam Overweight female in no acute distress Nose without purulence or discharge noted No skin breakdown orPressure necrosis from the CPAP mask Neck without lymphadenopathy or thyromegaly Lower extremities without edema, cyanosis Alert and oriented, moves all 4 extremities.       Assessment & Plan:

## 2013-02-28 NOTE — Patient Instructions (Addendum)
Continue on cpap for now, but consider trying a dental appliance to see how your do.  Let me know how things are going. Work on weight loss followup with me in one year if you remain on cpap.

## 2013-05-18 ENCOUNTER — Other Ambulatory Visit: Payer: Self-pay

## 2013-05-18 DIAGNOSIS — Z1231 Encounter for screening mammogram for malignant neoplasm of breast: Secondary | ICD-10-CM

## 2013-06-26 ENCOUNTER — Ambulatory Visit
Admission: RE | Admit: 2013-06-26 | Discharge: 2013-06-26 | Disposition: A | Discharge status: Home / Self Care | Payer: BC Managed Care – PPO | Source: Ambulatory Visit

## 2013-06-26 DIAGNOSIS — Z1231 Encounter for screening mammogram for malignant neoplasm of breast: Secondary | ICD-10-CM

## 2013-06-26 IMAGING — MG STANDARD SCREENING - COMBO
8 series · 8 of 24 positions shown · non-contrast
Comparison: Previous exam(s).

CLINICAL DATA: Screening.

EXAM:
DIGITAL SCREENING BILATERAL MAMMOGRAM WITH 3D TOMO WITH CAD

[R MLO]
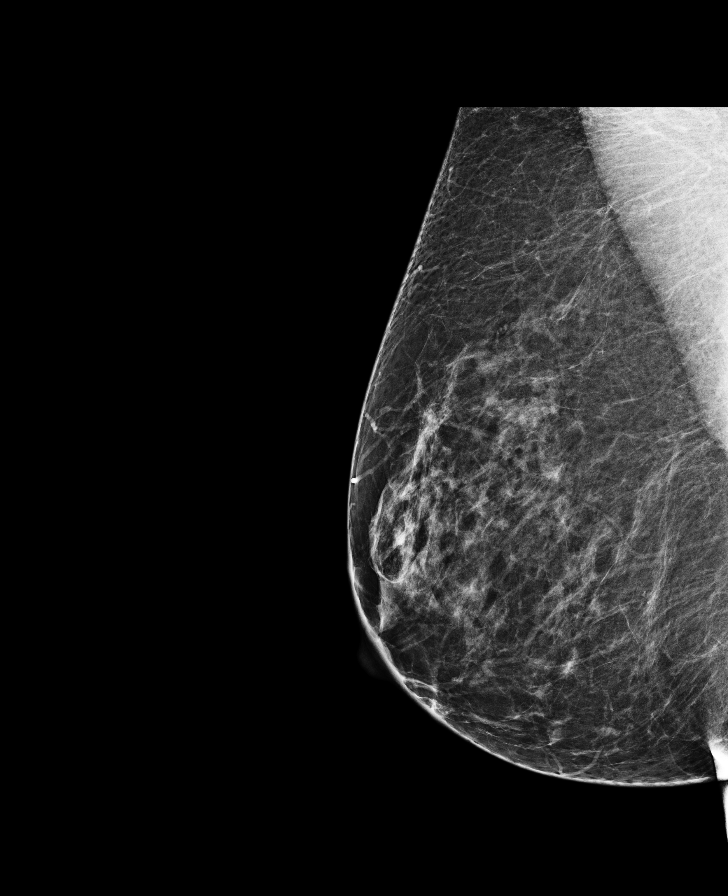

[L MLO]
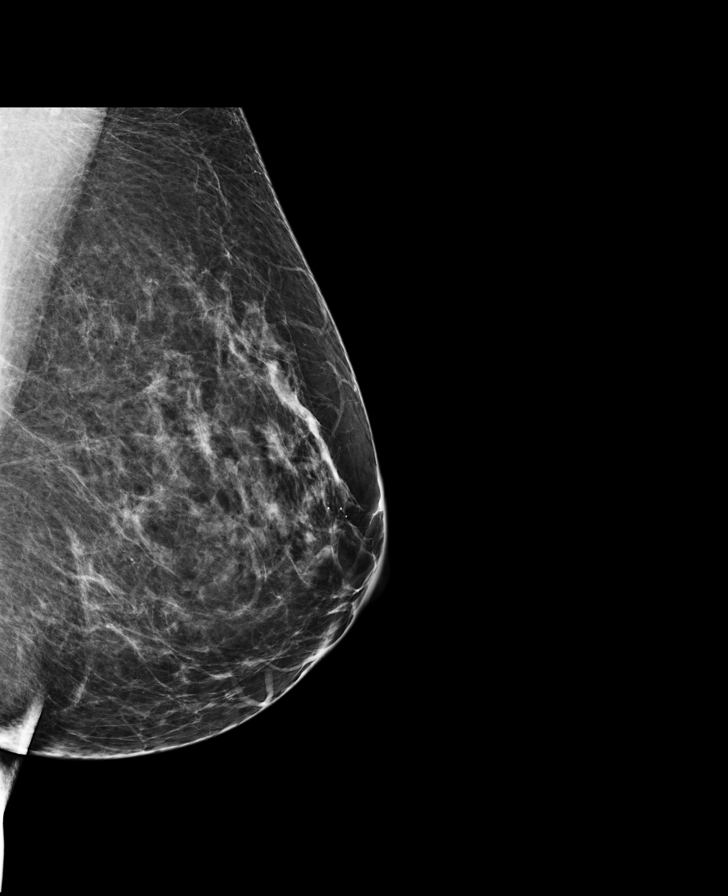

[L CC]
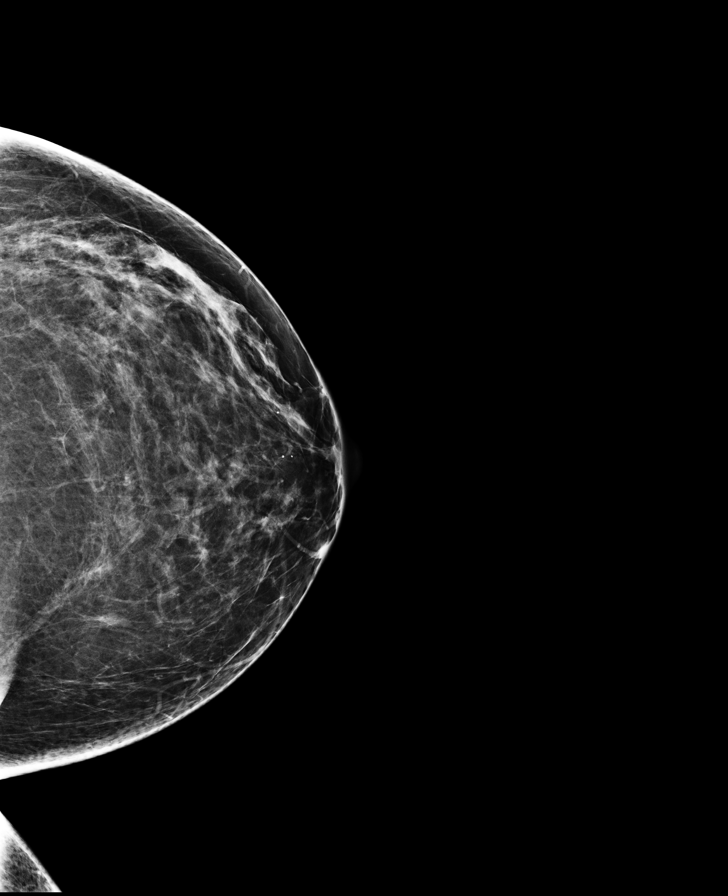

[R CC]
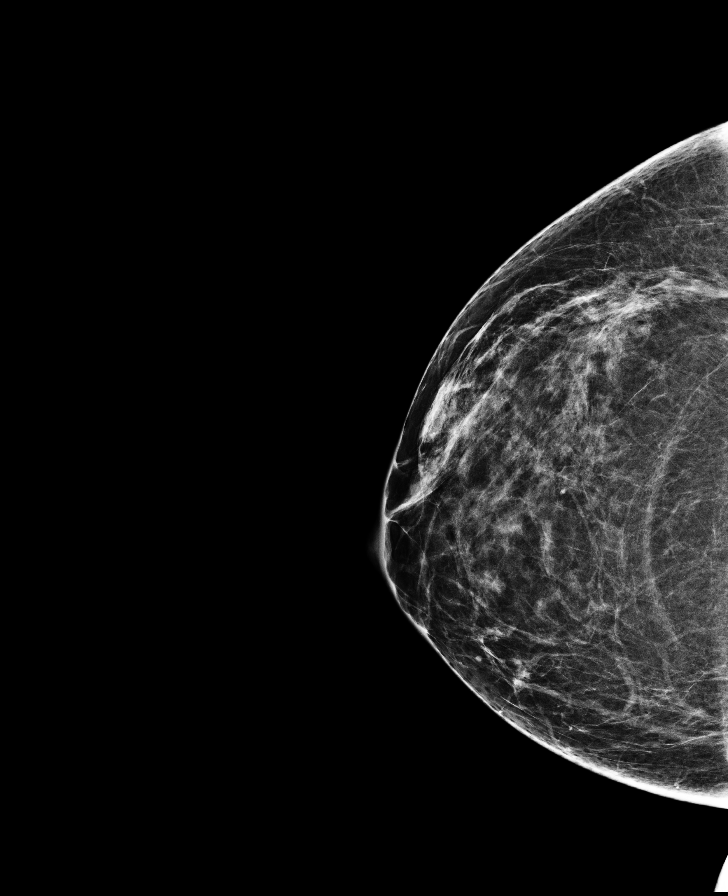

[R CC tomo · tomo slice 37/74.0]
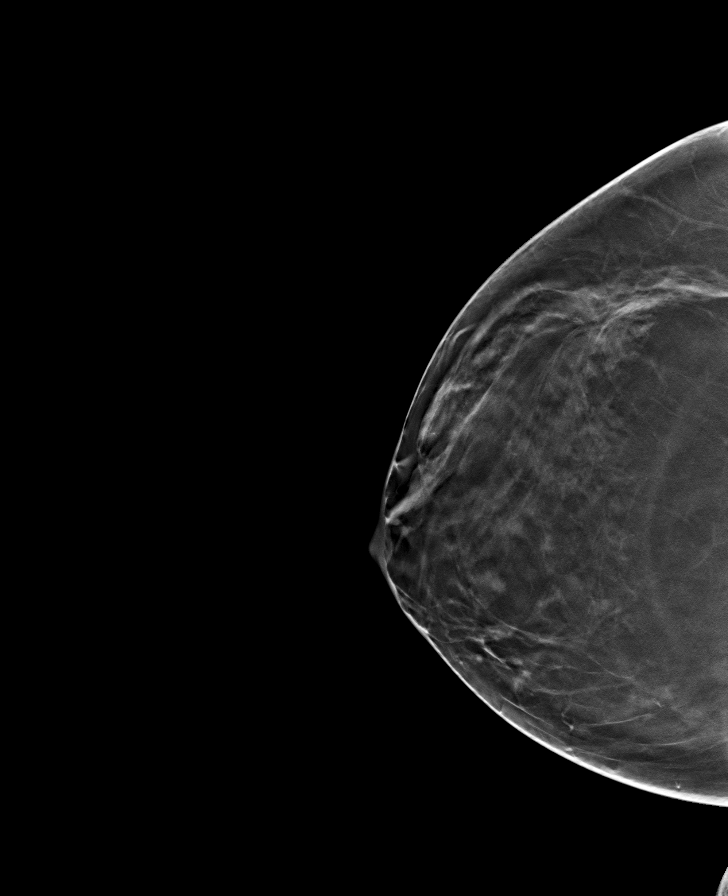

[L MLO tomo · tomo slice 39/78.0]
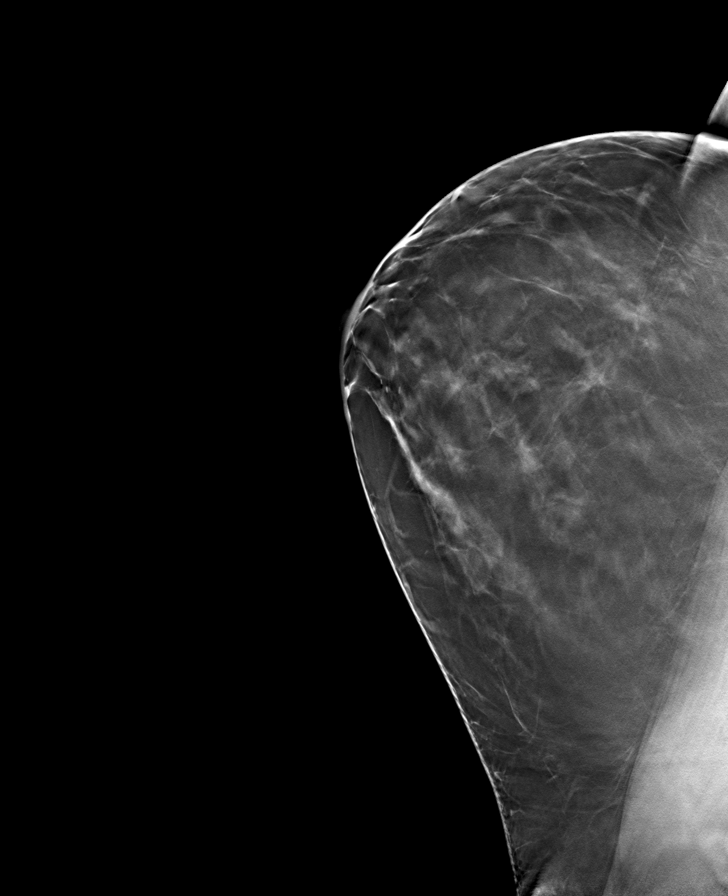

[R MLO tomo · tomo slice 41/82.0]
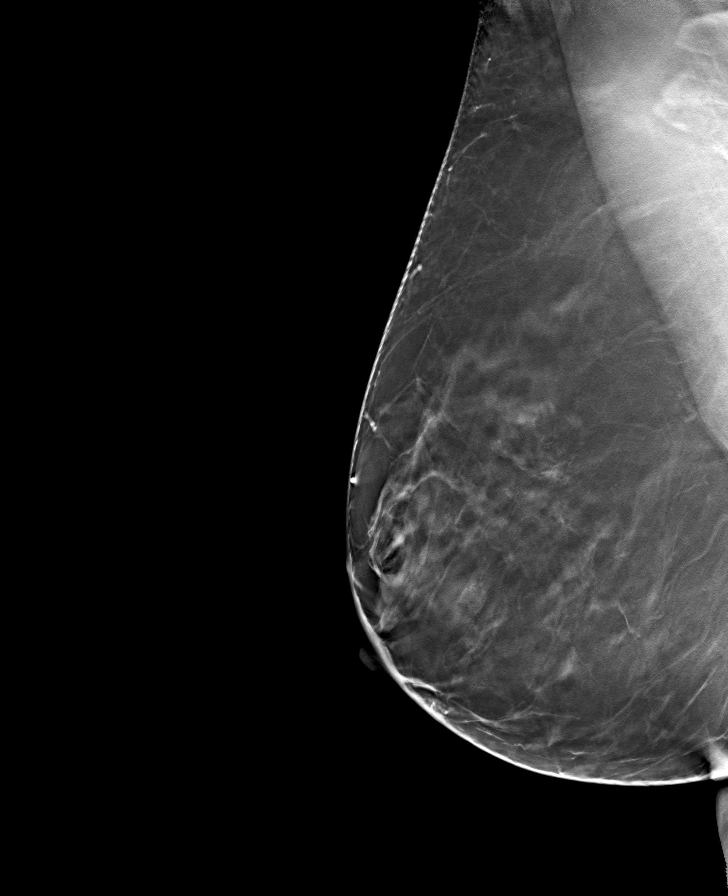

[L CC tomo · tomo slice 39/77.0]
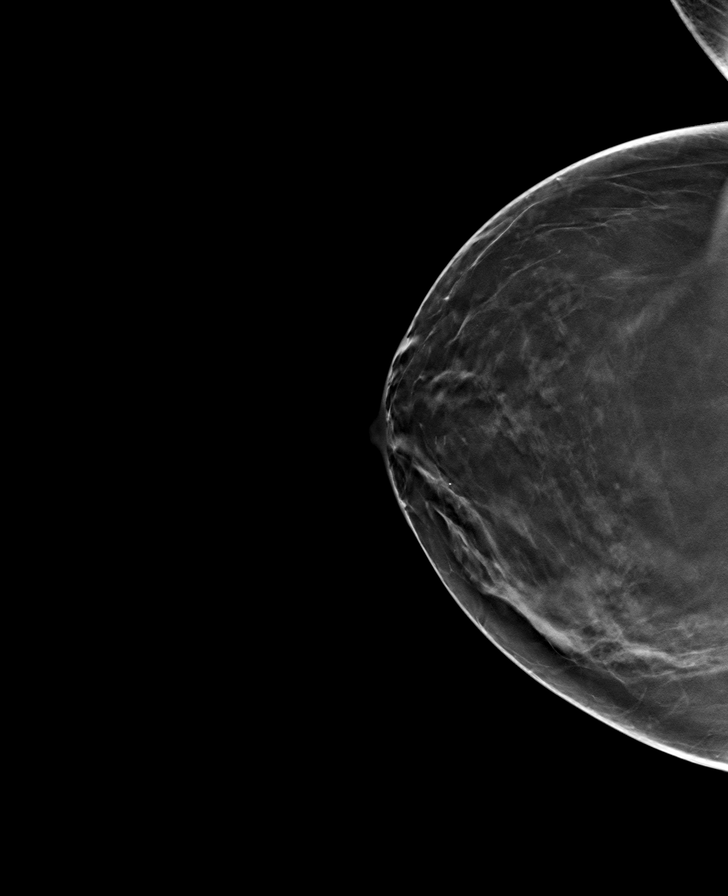

[8 of 24 positions shown; findings below may reference images not displayed]

ACR Breast Density Category b: There are scattered areas of
fibroglandular density.
FINDINGS: There are no findings suspicious for malignancy. Images were
processed with CAD.
IMPRESSION: No mammographic evidence of malignancy. A result letter of this
screening mammogram will be mailed directly to the patient.

RECOMMENDATION:
Screening mammogram in one year. (Code:[8N])

BI-RADS CATEGORY  1: Negative

## 2014-03-21 ENCOUNTER — Other Ambulatory Visit: Payer: Self-pay | Admitting: Obstetrics and Gynecology

## 2014-03-22 LAB — CYTOLOGY - PAP

## 2014-05-21 ENCOUNTER — Other Ambulatory Visit: Payer: Self-pay

## 2014-05-21 DIAGNOSIS — Z1231 Encounter for screening mammogram for malignant neoplasm of breast: Secondary | ICD-10-CM

## 2014-06-27 ENCOUNTER — Ambulatory Visit
Admission: RE | Admit: 2014-06-27 | Discharge: 2014-06-27 | Disposition: A | Discharge status: Home / Self Care | Payer: BLUE CROSS/BLUE SHIELD | Source: Ambulatory Visit

## 2014-06-27 ENCOUNTER — Other Ambulatory Visit: Payer: Self-pay

## 2014-06-27 DIAGNOSIS — Z1231 Encounter for screening mammogram for malignant neoplasm of breast: Secondary | ICD-10-CM

## 2014-06-27 IMAGING — MG MM SCREENING BREAST TOMO BILATERAL
8 series · 8 of 24 positions shown · non-contrast
Comparison: Previous exam(s).

CLINICAL DATA: Screening.

EXAM:
DIGITAL SCREENING BILATERAL MAMMOGRAM WITH 3D TOMO WITH CAD

[L MLO]
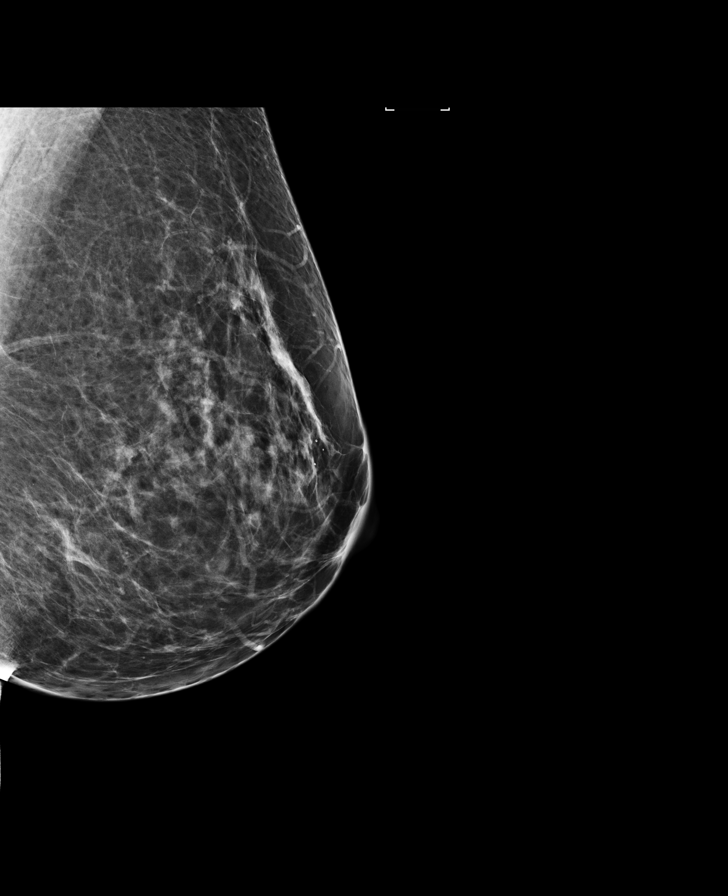

[L CC]
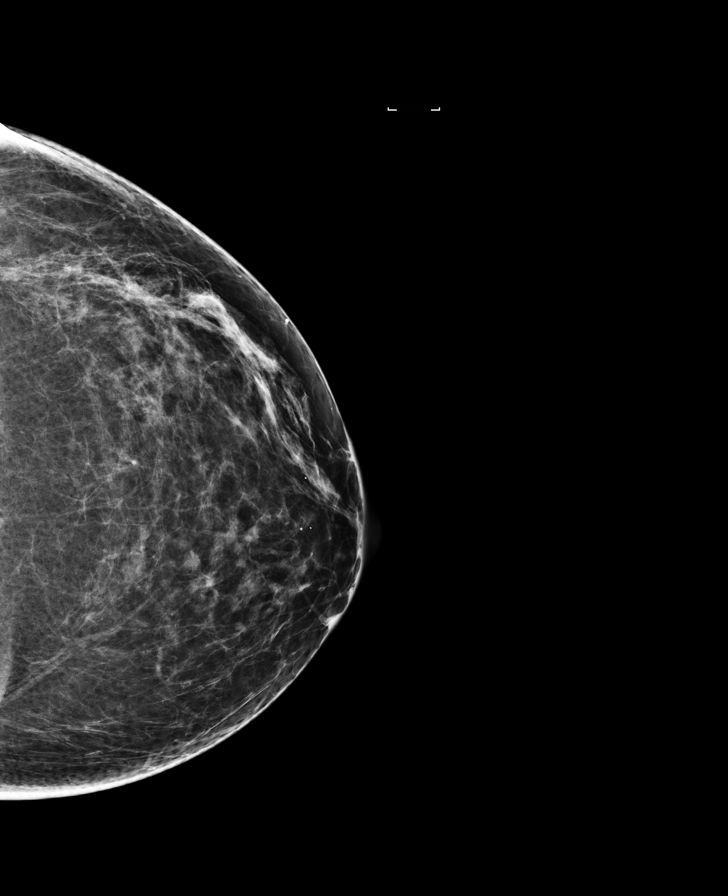

[R MLO]
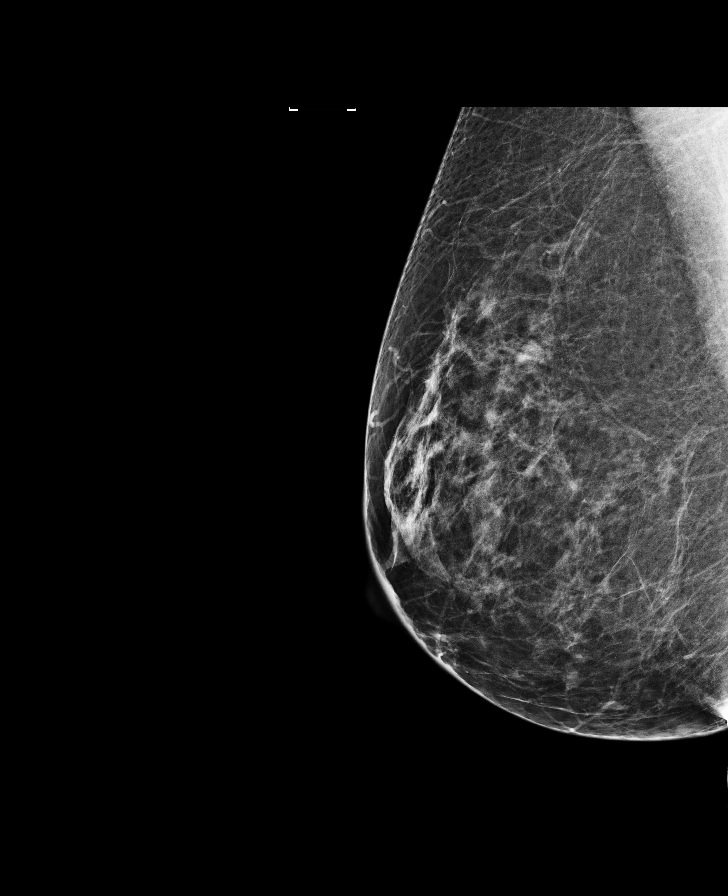

[R CC]
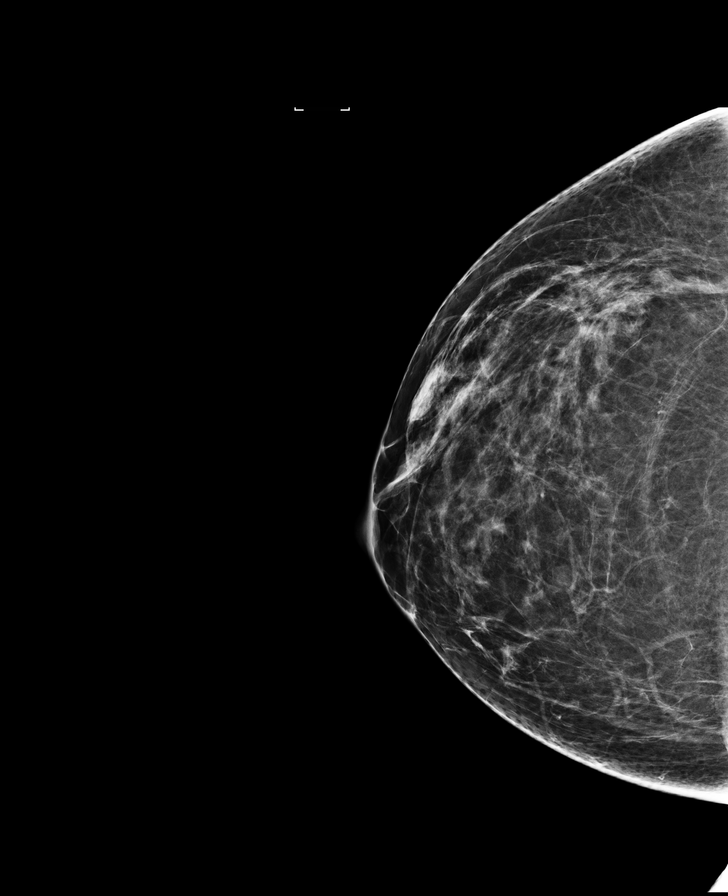

[L MLO tomo · tomo slice 41/80.0]
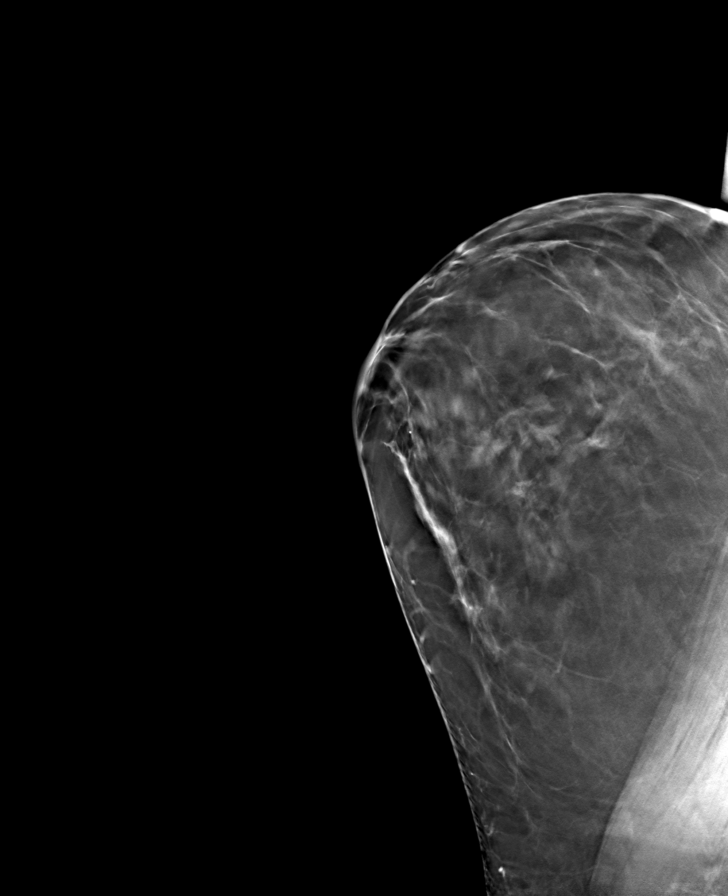

[L CC tomo · tomo slice 32/63.0]
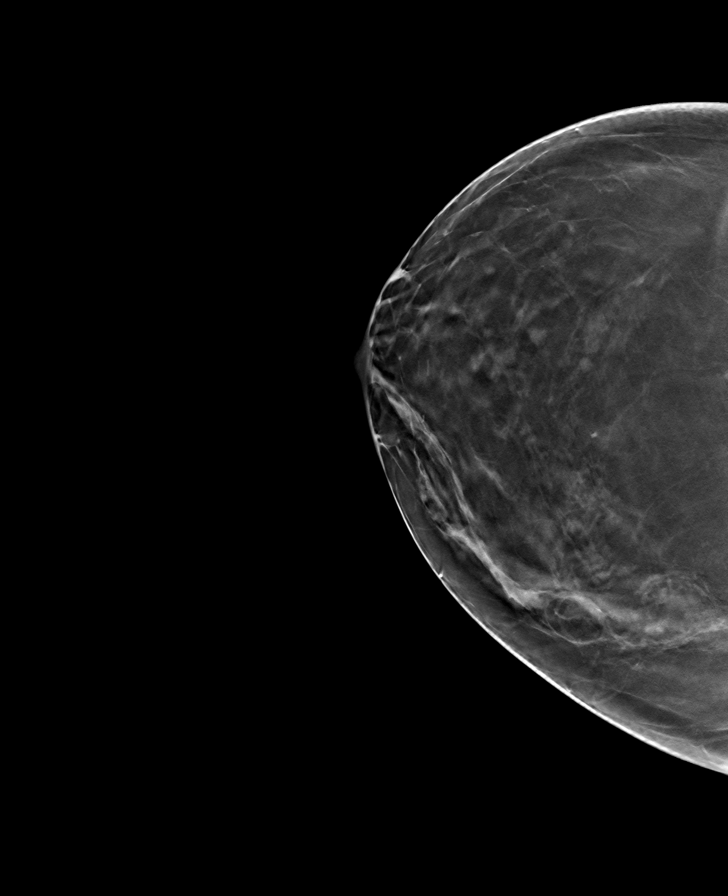

[R CC tomo · tomo slice 31/62.0]
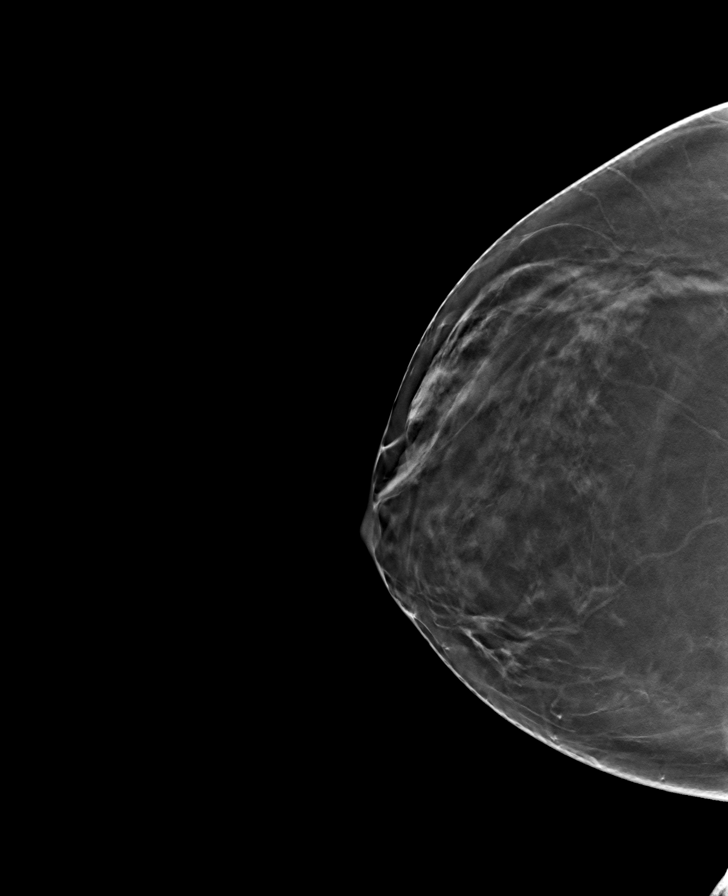

[R MLO tomo · tomo slice 33/66.0]
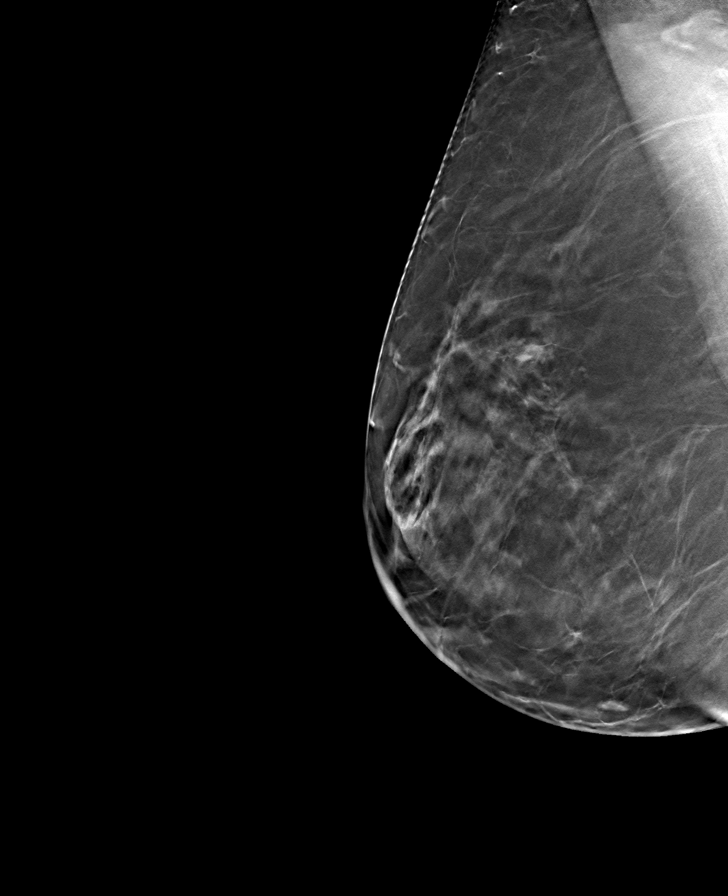

[8 of 24 positions shown; findings below may reference images not displayed]

ACR Breast Density Category b: There are scattered areas of
fibroglandular density.
FINDINGS: There are no findings suspicious for malignancy. Images were
processed with CAD.
IMPRESSION: No mammographic evidence of malignancy. A result letter of this
screening mammogram will be mailed directly to the patient.

RECOMMENDATION:
Screening mammogram in one year. (Code:[F0])

BI-RADS CATEGORY  1: Negative.

## 2014-07-02 ENCOUNTER — Ambulatory Visit (INDEPENDENT_AMBULATORY_CARE_PROVIDER_SITE_OTHER): Payer: BLUE CROSS/BLUE SHIELD | Admitting: Pulmonary Disease

## 2014-07-02 ENCOUNTER — Encounter: Payer: Self-pay | Admitting: Pulmonary Disease

## 2014-07-02 VITALS — BP 130/70 | HR 83 | Temp 97.0°F | Ht 65.0 in | Wt 182.8 lb

## 2014-07-02 DIAGNOSIS — G4733 Obstructive sleep apnea (adult) (pediatric): Secondary | ICD-10-CM

## 2014-07-02 NOTE — Patient Instructions (Signed)
Let me know if you are not satisfied with your current snore appliance, and I can refer you to someone who has significant experience with these devices. Work on weight reduction followup with me as needed.

## 2014-07-02 NOTE — Progress Notes (Signed)
   Subjective:    Patient ID: Maria Stewart, female    DOB: [birth year], 54 y.o.   MRN: [id number]  HPI The patient comes in today for follow-up of her known mild obstructive sleep apnea. She has quit wearing her C Pap, and started wearing a snoring appliance that has been created by her husband who works in a [employer].  Her first appliance did not work well, and she is currently trying a new one. She has not seen a dentist or orthodontist formally for measurements.   Review of Systems  Constitutional: Negative for fever and unexpected weight change.  HENT: Negative for congestion, dental problem, ear pain, nosebleeds, postnasal drip, rhinorrhea, sinus pressure, sneezing, sore throat and trouble swallowing.   Eyes: Negative for redness and itching.  Respiratory: Negative for cough, chest tightness, shortness of breath and wheezing.   Cardiovascular: Negative for palpitations and leg swelling.  Gastrointestinal: Negative for nausea and vomiting.  Genitourinary: Negative for dysuria.  Musculoskeletal: Negative for joint swelling.  Skin: Negative for rash.  Neurological: Negative for headaches.  Hematological: Does not bruise/bleed easily.  Psychiatric/Behavioral: Negative for dysphoric mood. The patient is not nervous/anxious.        Objective:   Physical Exam Overweight female in no acute distress Nose without purulence or discharge noted Neck without lymphadenopathy or thyromegaly Lower extremities without edema, no cyanosis Alert and oriented, does not appear to be sleepy, moves all 4 extremities.       Assessment & Plan:

## 2014-07-02 NOTE — Assessment & Plan Note (Signed)
The patient has discontinued C Pap because of inconvenience, and currently is wearing a snore appliance. Turns out that her husband works in a [employer], and he has made her a device to try. I have strongly encouraged her to seek professional medical help with her appliance in the form of either a dentist or orthodontist, and stressed to her the potential dangers of using a dental appliance has not been fitted or adjusted properly. The patient states that she will keep this in mind, but will continue working with the device from her husband. I have offered to be a resource for her should issues arise, and also encouraged to work aggressively on weight loss.

## 2015-05-21 ENCOUNTER — Other Ambulatory Visit: Payer: Self-pay

## 2015-05-21 DIAGNOSIS — Z1231 Encounter for screening mammogram for malignant neoplasm of breast: Secondary | ICD-10-CM

## 2015-07-01 ENCOUNTER — Ambulatory Visit
Admission: RE | Admit: 2015-07-01 | Discharge: 2015-07-01 | Disposition: A | Discharge status: Home / Self Care | Payer: PRIVATE HEALTH INSURANCE | Source: Ambulatory Visit

## 2015-07-01 DIAGNOSIS — Z1231 Encounter for screening mammogram for malignant neoplasm of breast: Secondary | ICD-10-CM

## 2015-07-01 IMAGING — MG MM SCREENING BREAST TOMO BILATERAL
8 series · 8 of 24 positions shown · non-contrast
Comparison: Previous exam(s).

CLINICAL DATA: Screening.

EXAM:
DIGITAL SCREENING BILATERAL MAMMOGRAM WITH 3D TOMO WITH CAD

[R MLO]
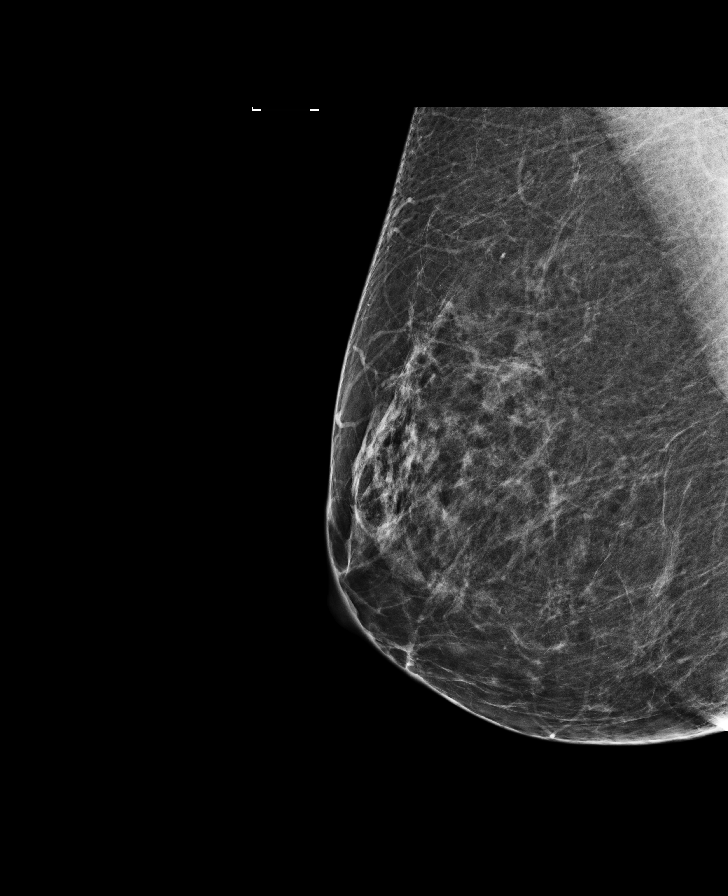

[R CC]
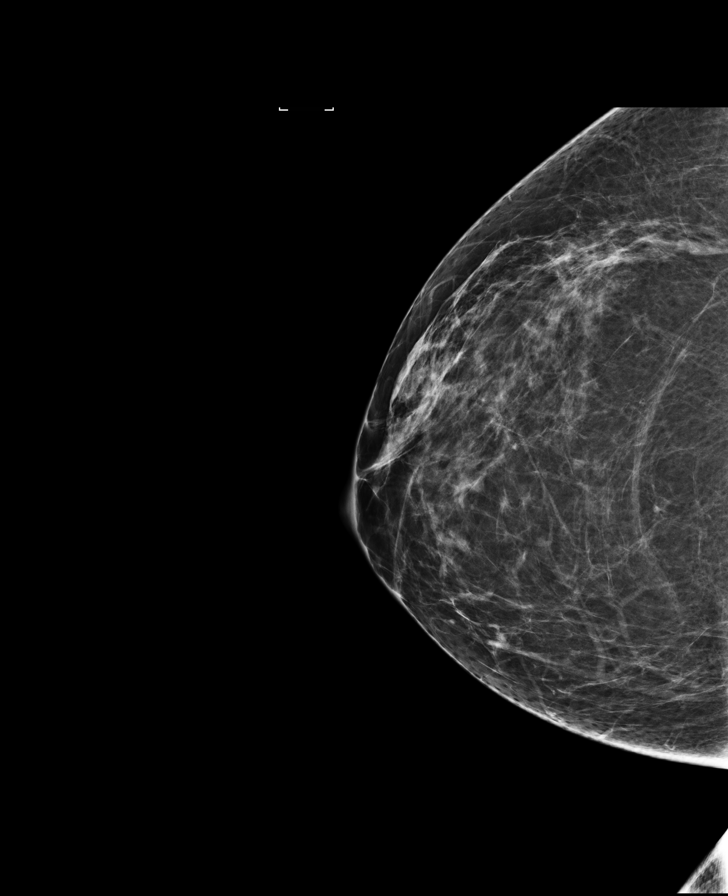

[L CC]
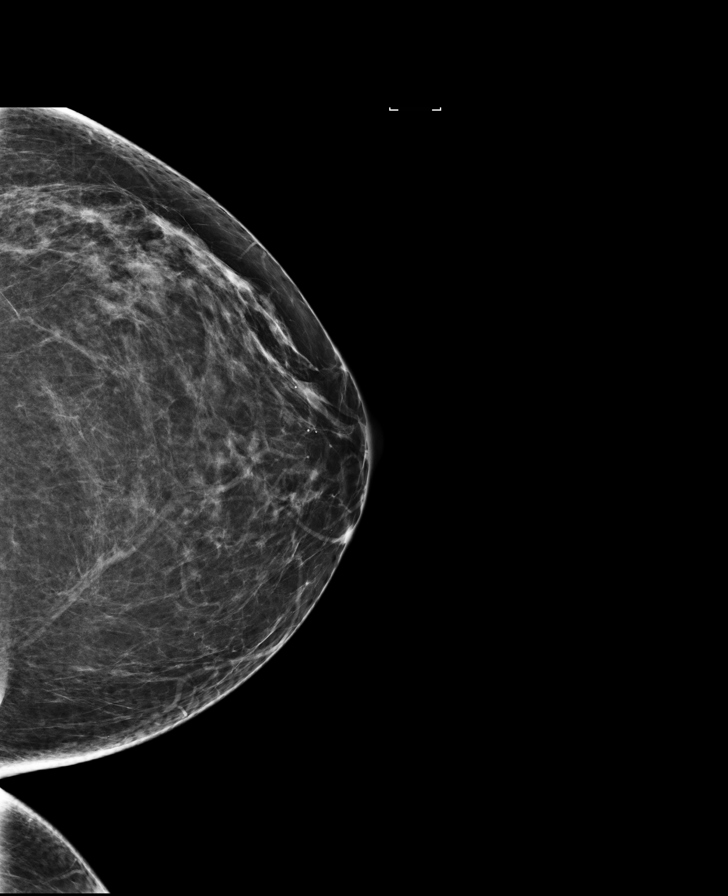

[L MLO]
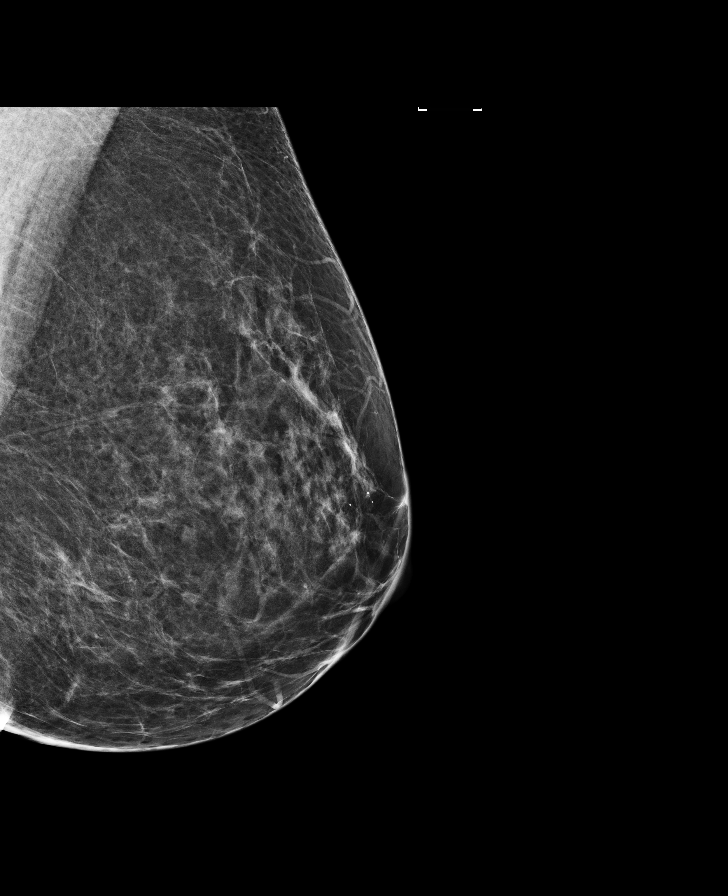

[R CC tomo · tomo slice 39/78.0]
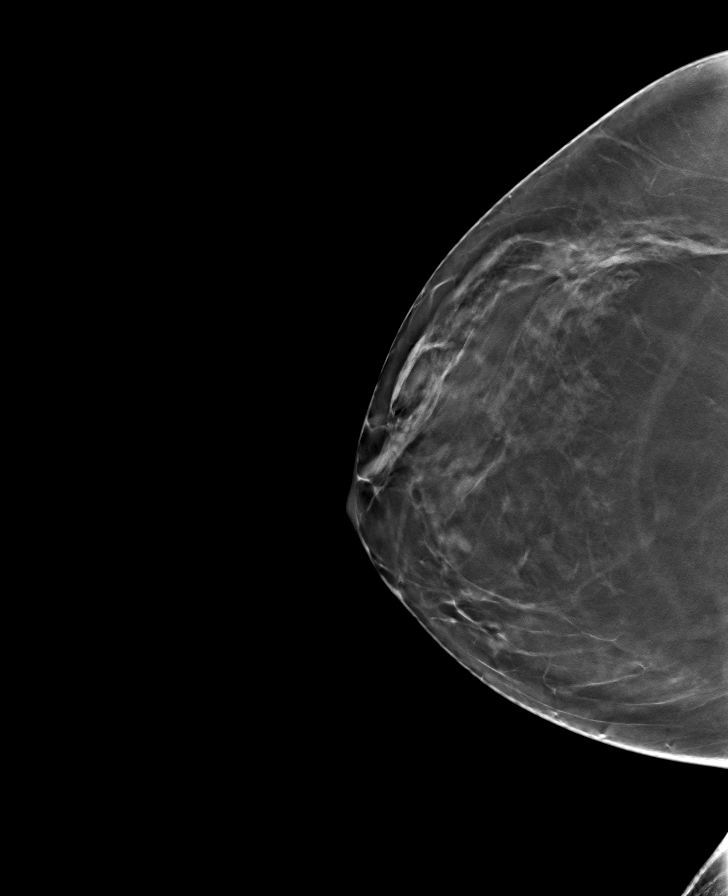

[L MLO tomo · tomo slice 39/76.0]
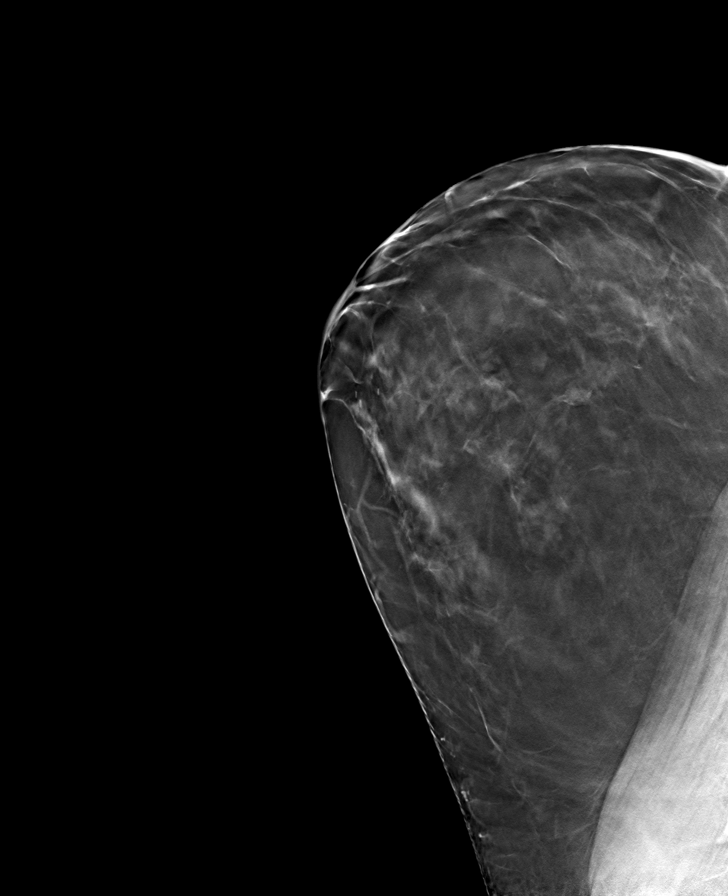

[R MLO tomo · tomo slice 39/77.0]
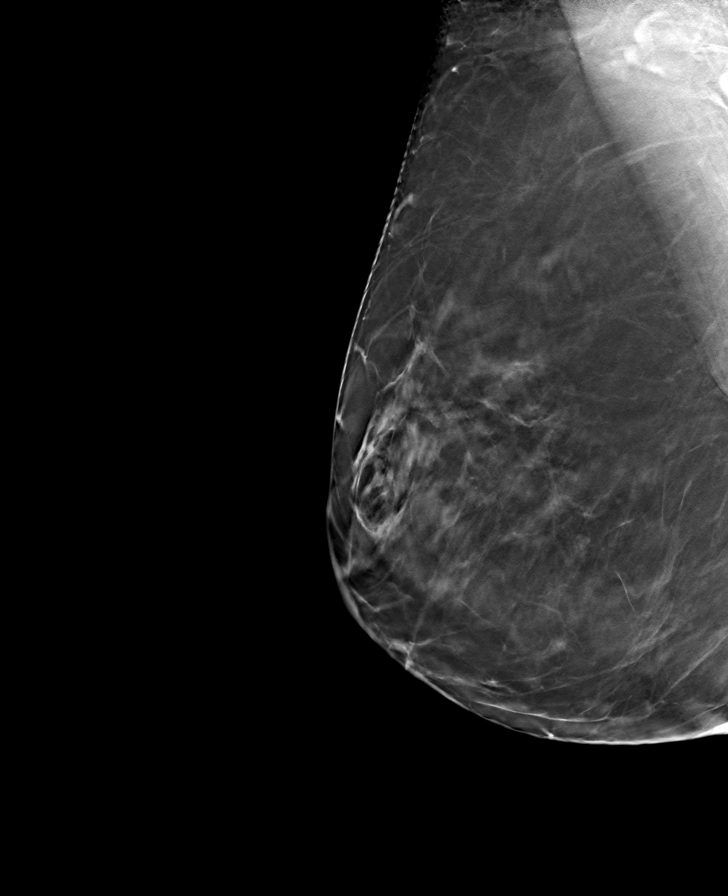

[L CC tomo · tomo slice 36/71.0]
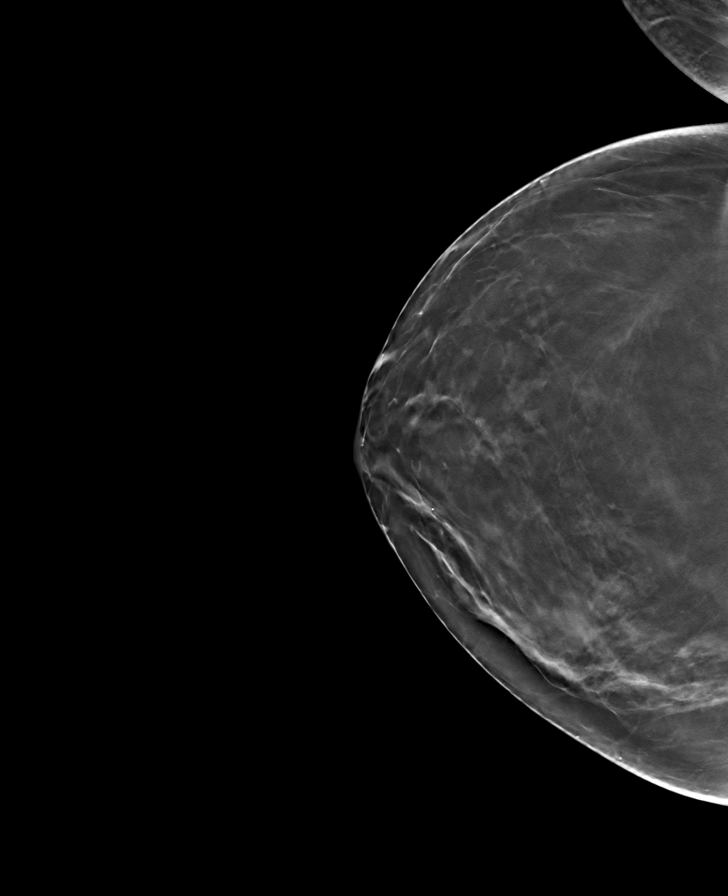

[8 of 24 positions shown; findings below may reference images not displayed]

ACR Breast Density Category b: There are scattered areas of
fibroglandular density.
FINDINGS: There are no findings suspicious for malignancy. Images were
processed with CAD.
IMPRESSION: No mammographic evidence of malignancy. A result letter of this
screening mammogram will be mailed directly to the patient.

RECOMMENDATION:
Screening mammogram in one year. (Code:[F0])

BI-RADS CATEGORY  1: Negative.

## 2016-05-28 ENCOUNTER — Other Ambulatory Visit: Payer: Self-pay | Admitting: Obstetrics and Gynecology

## 2016-05-28 DIAGNOSIS — Z1231 Encounter for screening mammogram for malignant neoplasm of breast: Secondary | ICD-10-CM

## 2016-07-13 ENCOUNTER — Ambulatory Visit
Admission: RE | Admit: 2016-07-13 | Discharge: 2016-07-13 | Disposition: A | Discharge status: Home / Self Care | Payer: PRIVATE HEALTH INSURANCE | Source: Ambulatory Visit | Attending: Obstetrics and Gynecology | Admitting: Obstetrics and Gynecology

## 2016-07-13 DIAGNOSIS — Z1231 Encounter for screening mammogram for malignant neoplasm of breast: Secondary | ICD-10-CM

## 2016-07-13 IMAGING — MG 2D DIGITAL SCREENING BILATERAL MAMMOGRAM WITH CAD AND ADJUNCT TO
8 of 12 series · 8 of 28 positions shown · non-contrast
Comparison: Previous exam(s).

CLINICAL DATA: Screening.

EXAM:
2D DIGITAL SCREENING BILATERAL MAMMOGRAM WITH CAD AND ADJUNCT TOMO

[R MLO synth-2D]
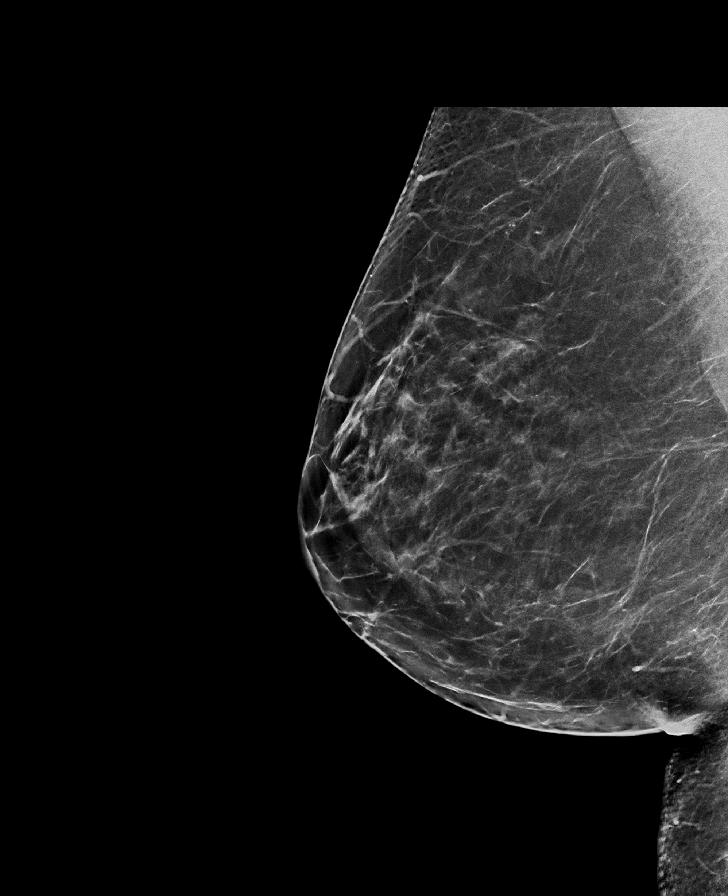

[R CC synth-2D]
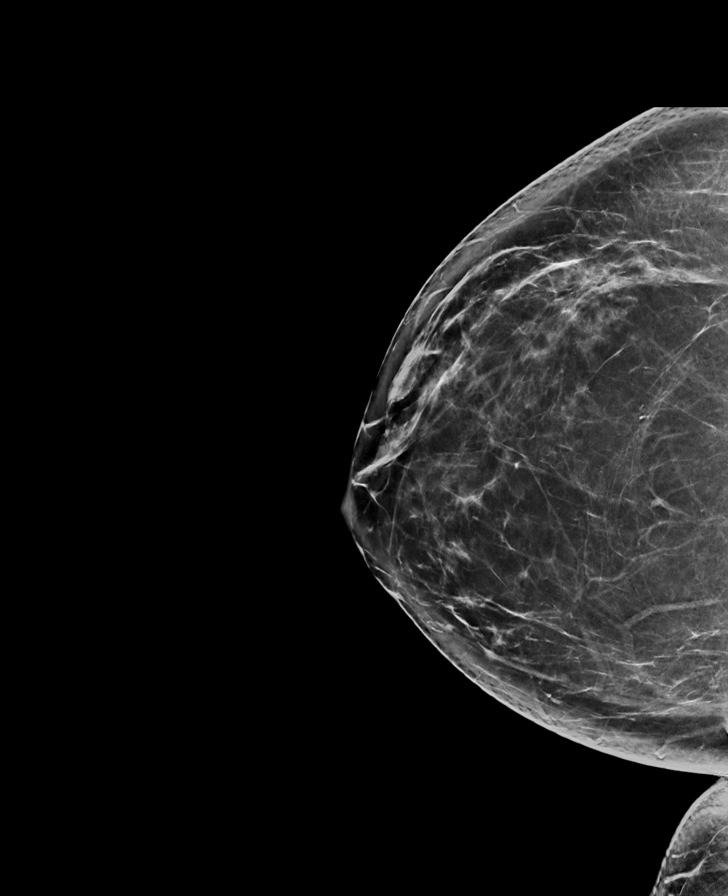

[L MLO]
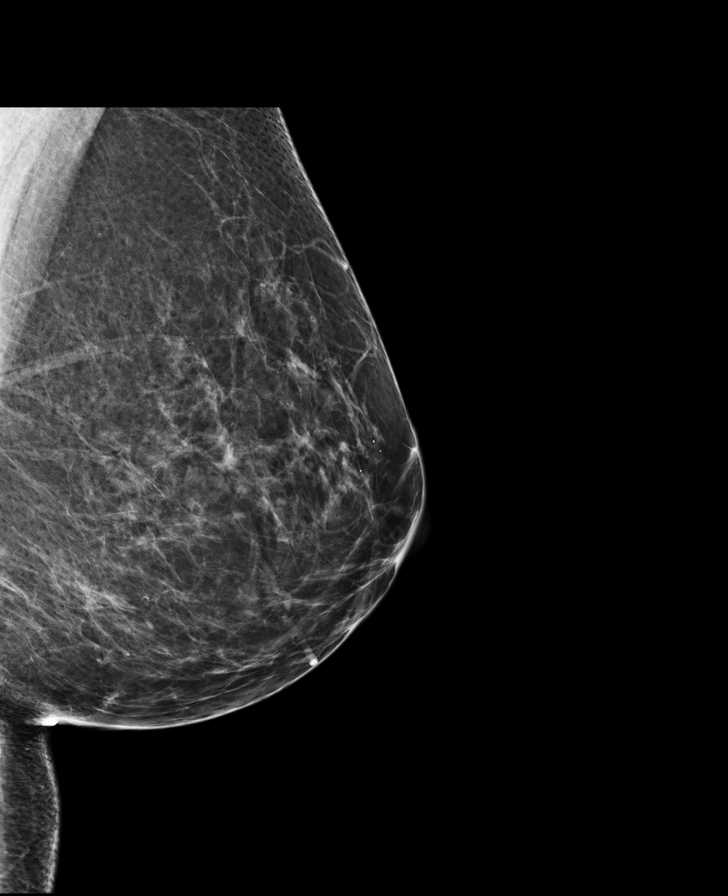

[L CC]
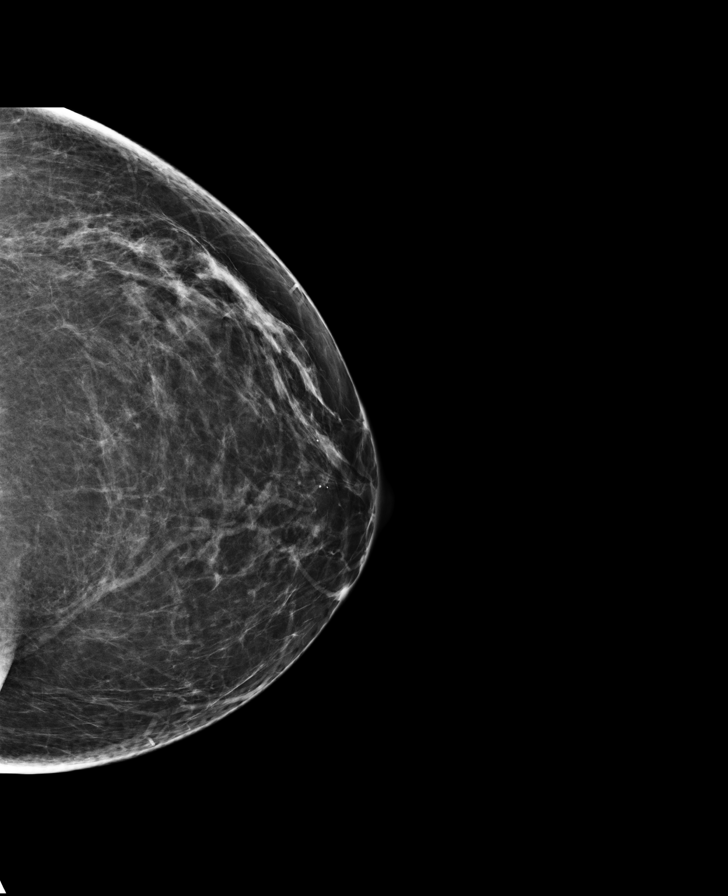

[L CC synth-2D]
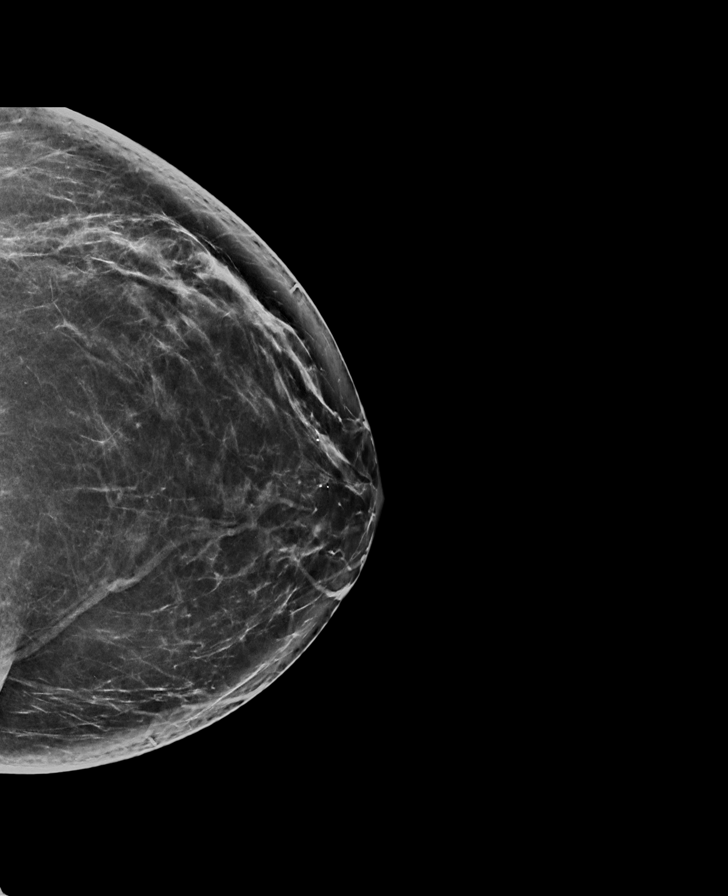

[R MLO]
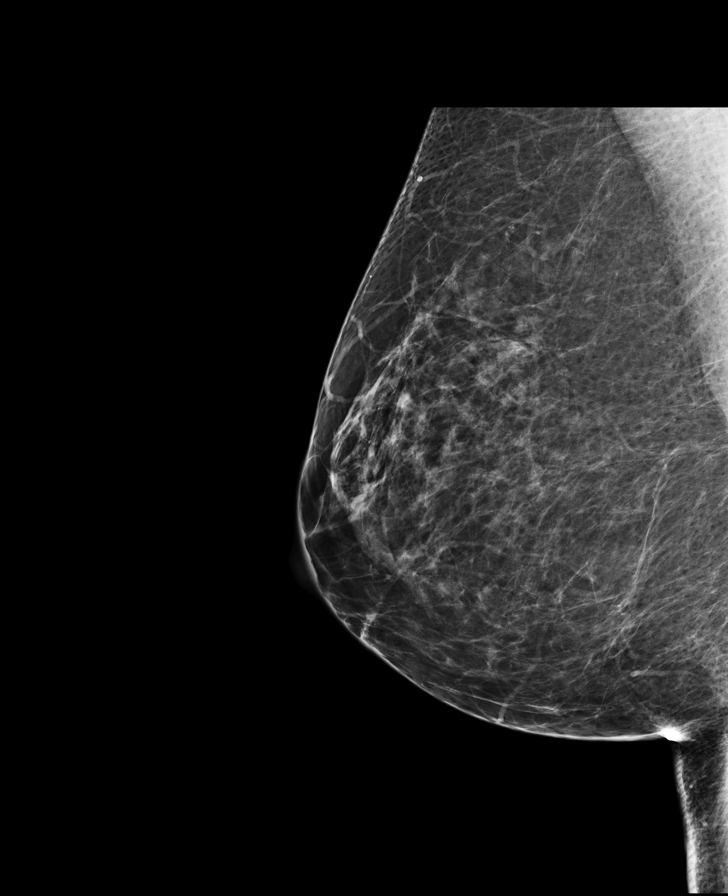

[L MLO synth-2D]
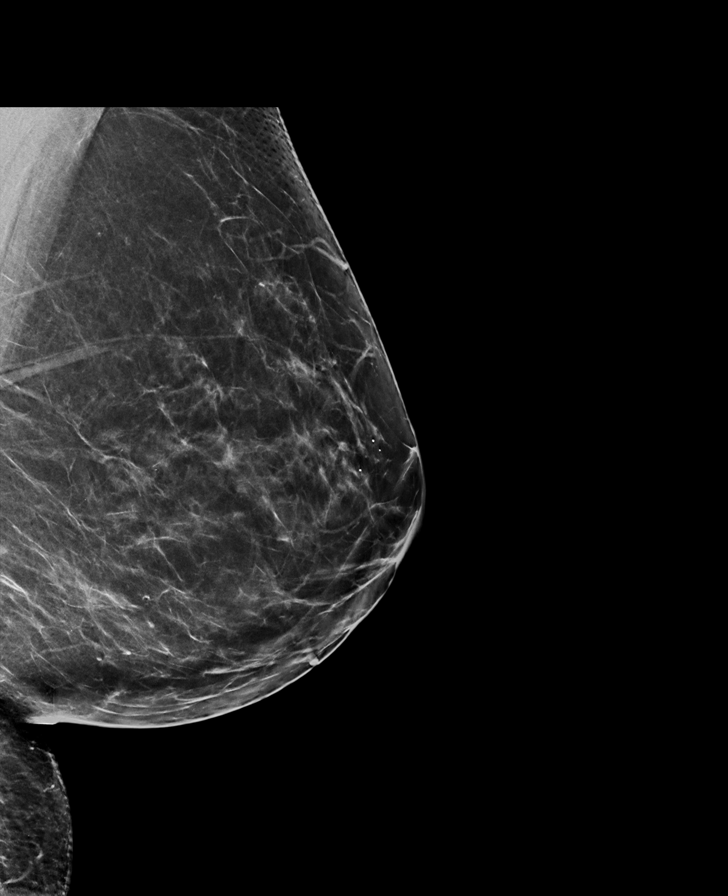

[R CC]
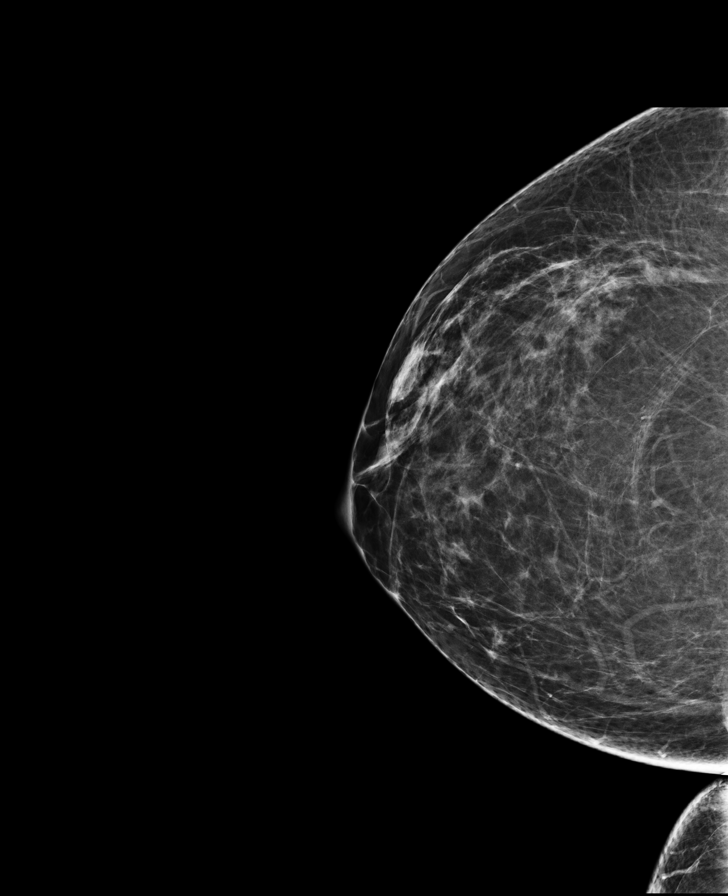

[8 of 28 positions shown; findings below may reference images not displayed]

ACR Breast Density Category b: There are scattered areas of
fibroglandular density.
FINDINGS: There are no findings suspicious for malignancy. Images were
processed with CAD.
IMPRESSION: No mammographic evidence of malignancy. A result letter of this
screening mammogram will be mailed directly to the patient.

RECOMMENDATION:
Screening mammogram in one year. (Code:[33])

BI-RADS CATEGORY  1: Negative.

## 2017-06-09 ENCOUNTER — Other Ambulatory Visit: Payer: Self-pay | Admitting: Obstetrics and Gynecology

## 2017-06-09 DIAGNOSIS — Z1231 Encounter for screening mammogram for malignant neoplasm of breast: Secondary | ICD-10-CM

## 2017-07-14 ENCOUNTER — Ambulatory Visit
Admission: RE | Admit: 2017-07-14 | Discharge: 2017-07-14 | Disposition: A | Discharge status: Home / Self Care | Payer: PRIVATE HEALTH INSURANCE | Source: Ambulatory Visit | Attending: Obstetrics and Gynecology | Admitting: Obstetrics and Gynecology

## 2017-07-14 DIAGNOSIS — Z1231 Encounter for screening mammogram for malignant neoplasm of breast: Secondary | ICD-10-CM

## 2017-07-14 IMAGING — MG 2D DIGITAL SCREENING BILATERAL MAMMOGRAM WITH 3D TOMO WITH CAD
8 of 12 series · 8 of 28 positions shown · non-contrast
Comparison: Previous exam(s).

CLINICAL DATA: Screening.

EXAM:
2D DIGITAL SCREENING BILATERAL MAMMOGRAM WITH 3D TOMO WITH CAD

[R CC synth-2D]
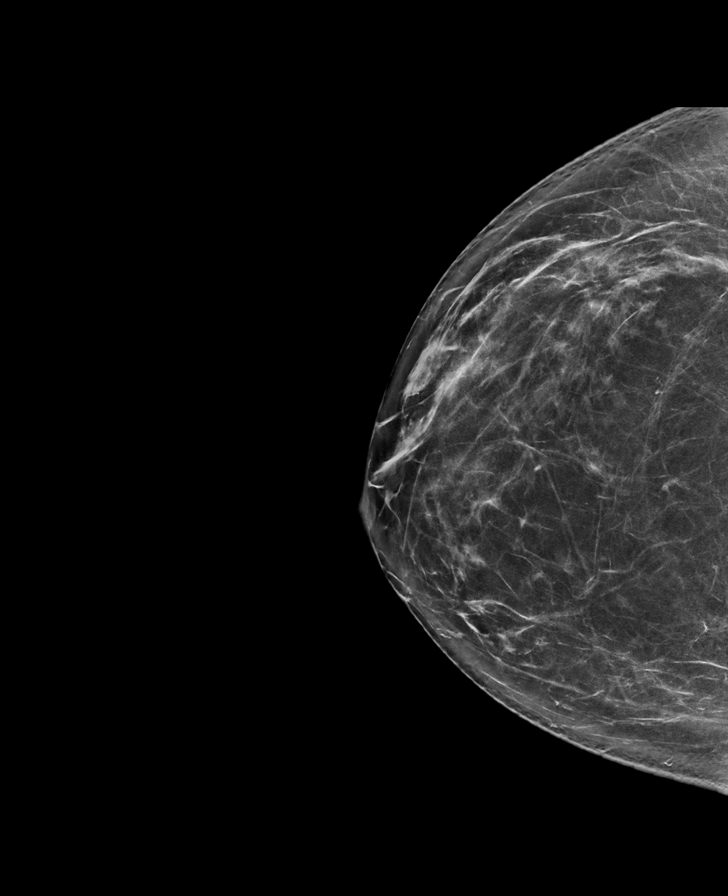

[L CC synth-2D]
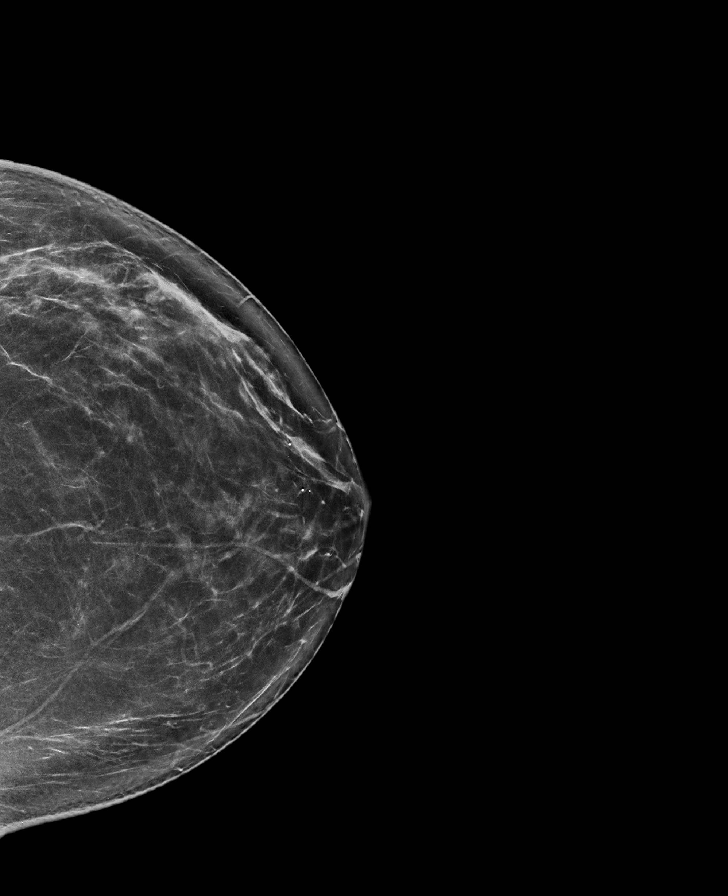

[L CC]
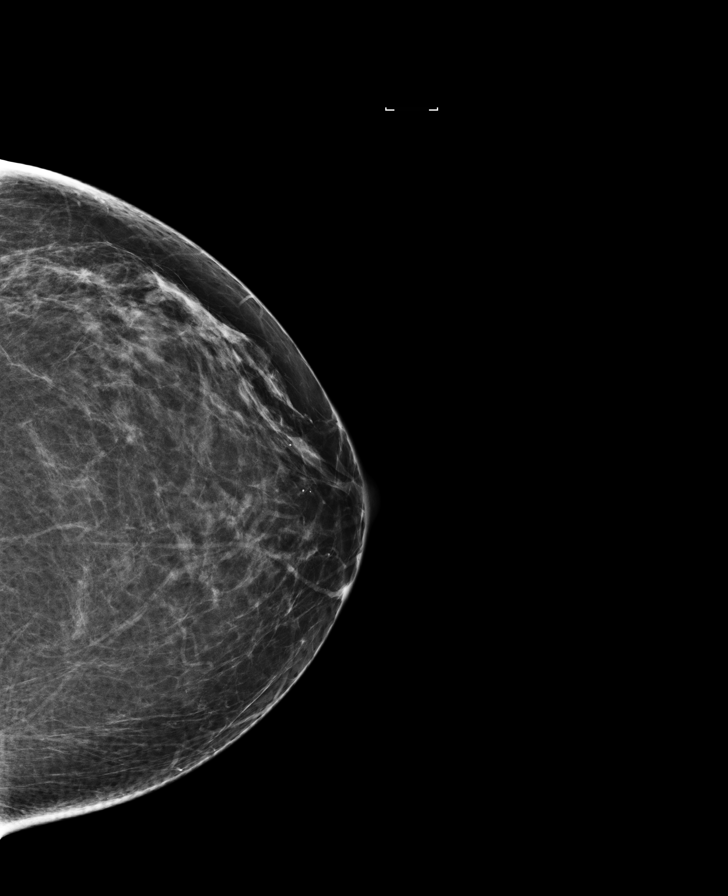

[L MLO synth-2D]
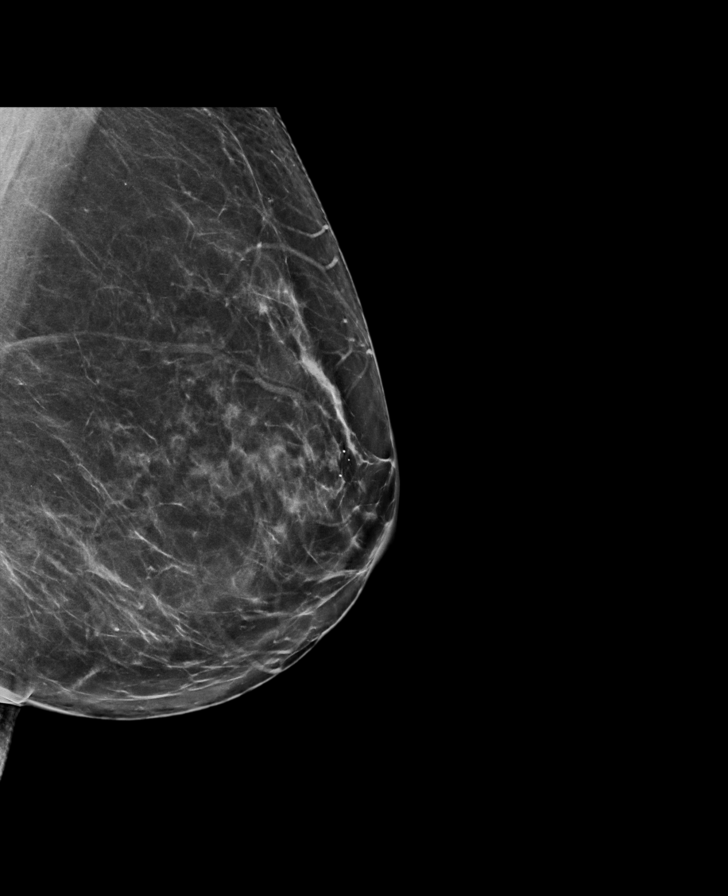

[R CC]
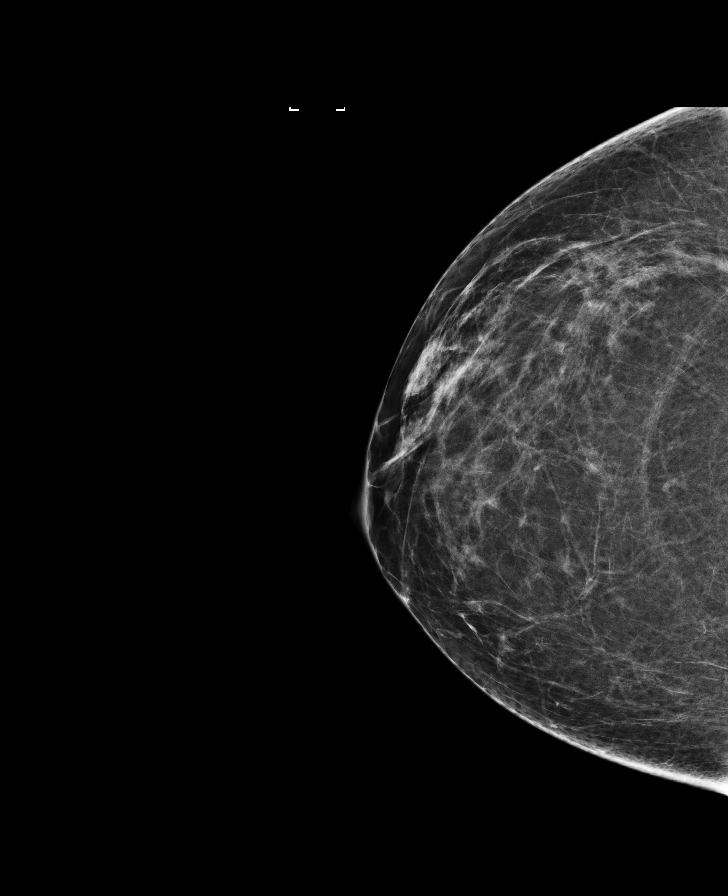

[R MLO]
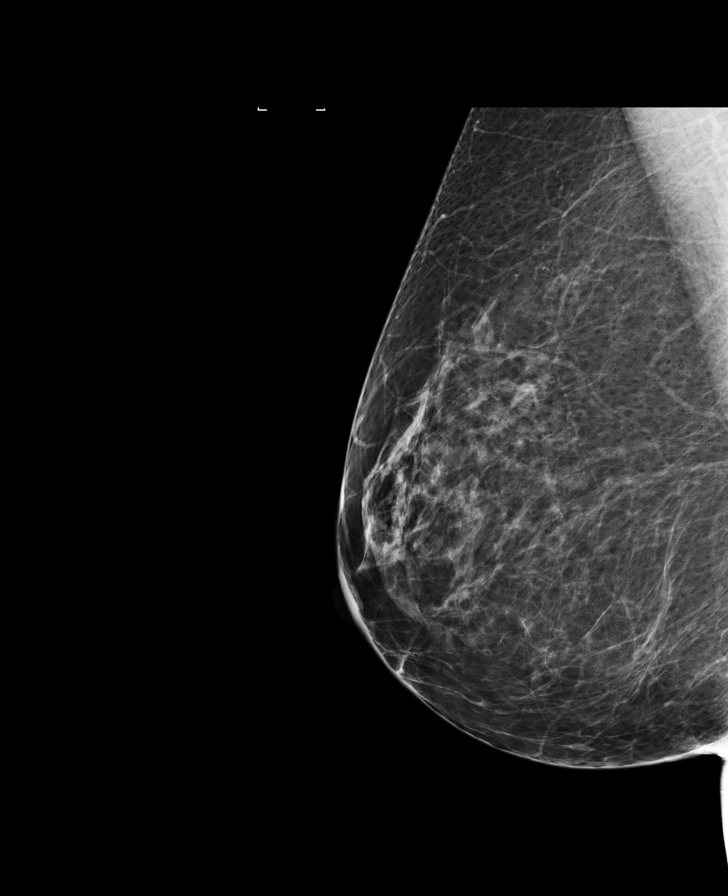

[R MLO synth-2D]
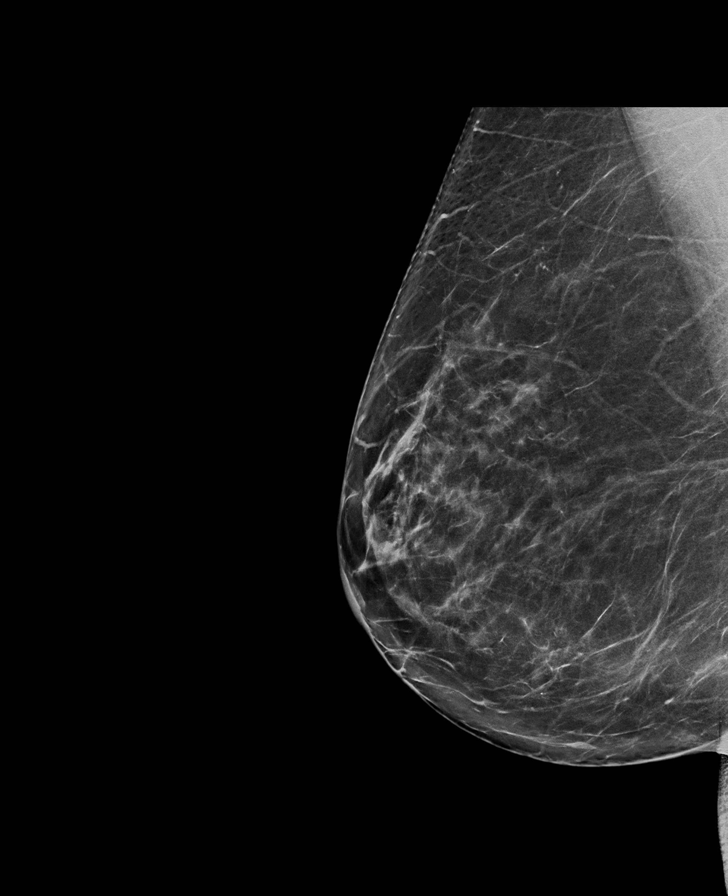

[L MLO]
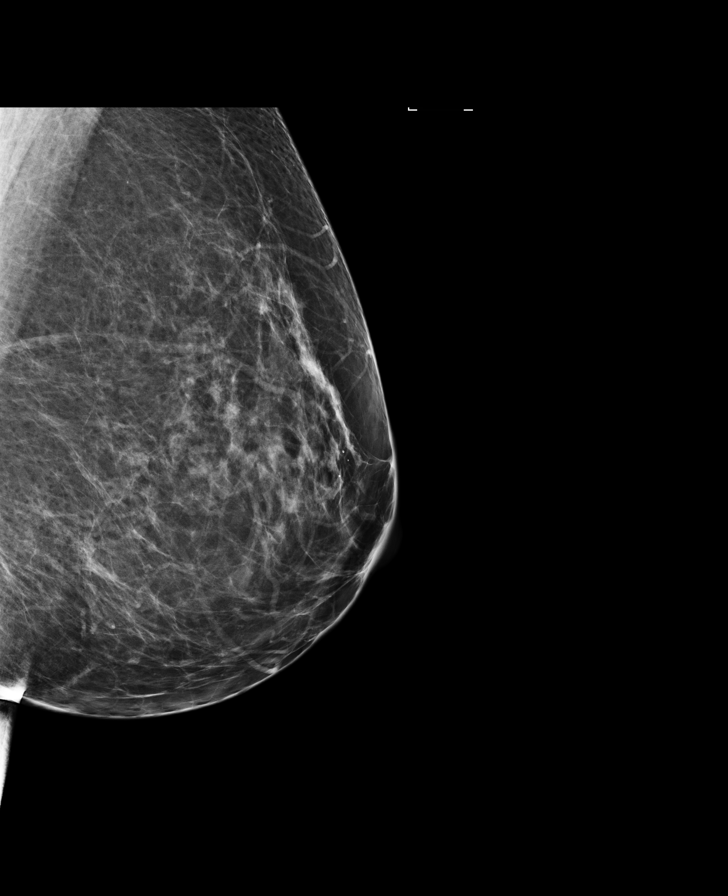

[8 of 28 positions shown; findings below may reference images not displayed]

ACR Breast Density Category b: There are scattered areas of
fibroglandular density.
FINDINGS: There are no findings suspicious for malignancy. Images were
processed with CAD.
IMPRESSION: No mammographic evidence of malignancy. A result letter of this
screening mammogram will be mailed directly to the patient.

RECOMMENDATION:
Screening mammogram in one year. (Code:[4P])

BI-RADS CATEGORY  1: Negative.

## 2018-06-15 HISTORY — PX: BREAST BIOPSY: SHX20

## 2018-06-16 ENCOUNTER — Other Ambulatory Visit: Payer: Self-pay | Admitting: Obstetrics and Gynecology

## 2018-06-16 DIAGNOSIS — Z1231 Encounter for screening mammogram for malignant neoplasm of breast: Secondary | ICD-10-CM

## 2018-07-18 ENCOUNTER — Ambulatory Visit
Admission: RE | Admit: 2018-07-18 | Discharge: 2018-07-18 | Disposition: A | Discharge status: Home / Self Care | Payer: PRIVATE HEALTH INSURANCE | Source: Ambulatory Visit | Attending: Obstetrics and Gynecology | Admitting: Obstetrics and Gynecology

## 2018-07-18 DIAGNOSIS — Z1231 Encounter for screening mammogram for malignant neoplasm of breast: Secondary | ICD-10-CM

## 2018-07-18 IMAGING — MG DIGITAL SCREENING BILATERAL MAMMOGRAM WITH TOMO AND CAD
8 series · 8 of 24 positions shown · non-contrast
Comparison: Previous exam(s).

CLINICAL DATA: Screening.

EXAM:
DIGITAL SCREENING BILATERAL MAMMOGRAM WITH TOMO AND CAD

[L MLO synth-2D]
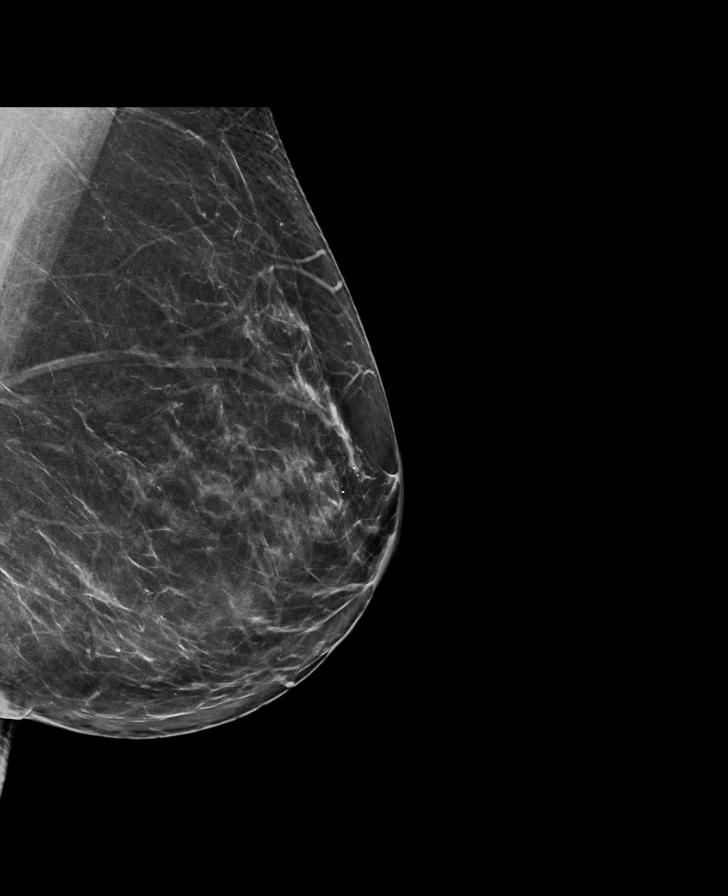

[R CC synth-2D]
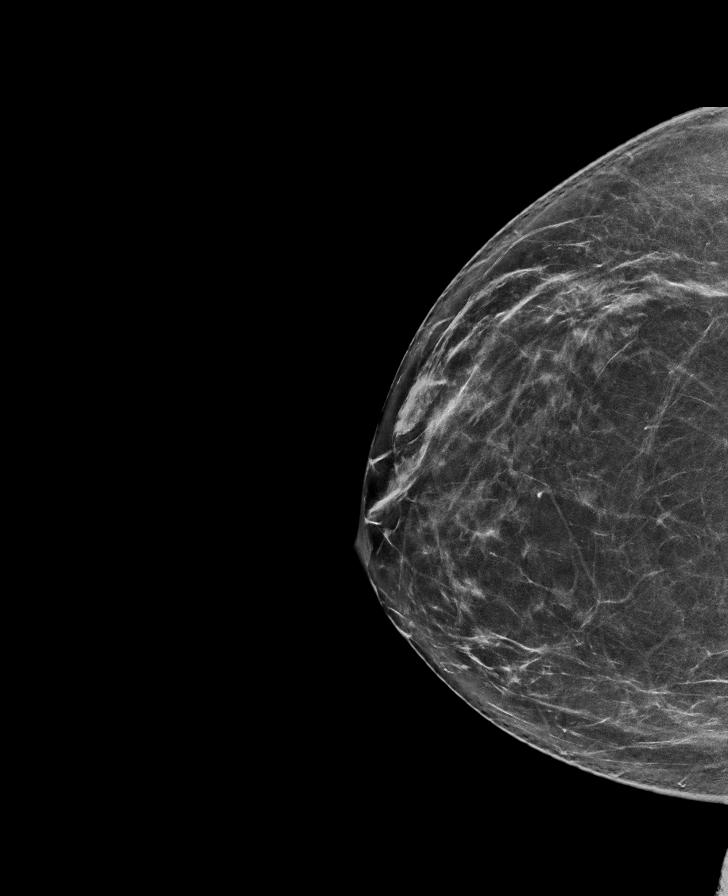

[R MLO synth-2D]
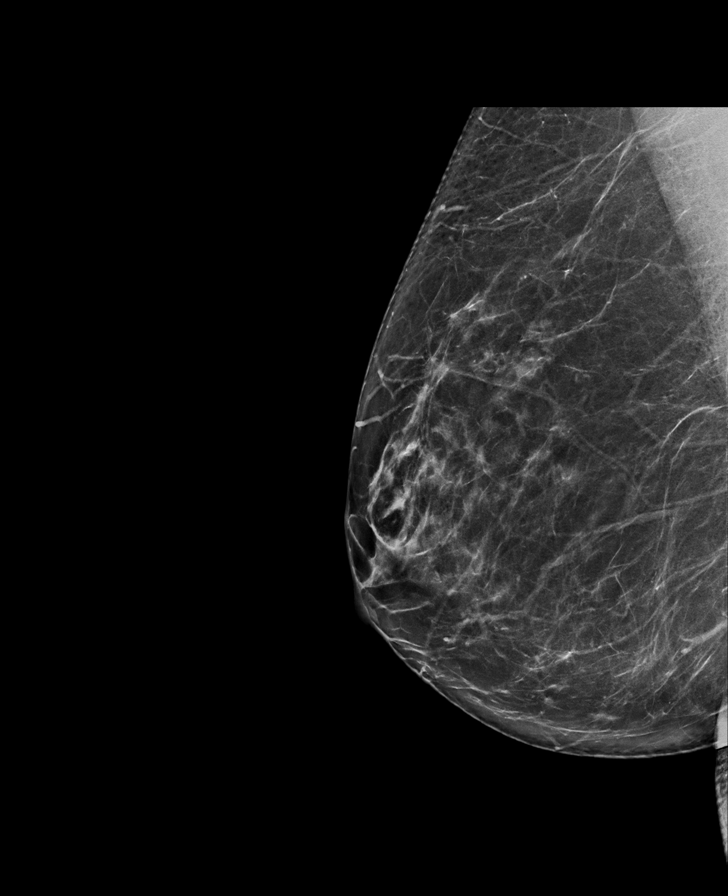

[L CC synth-2D]
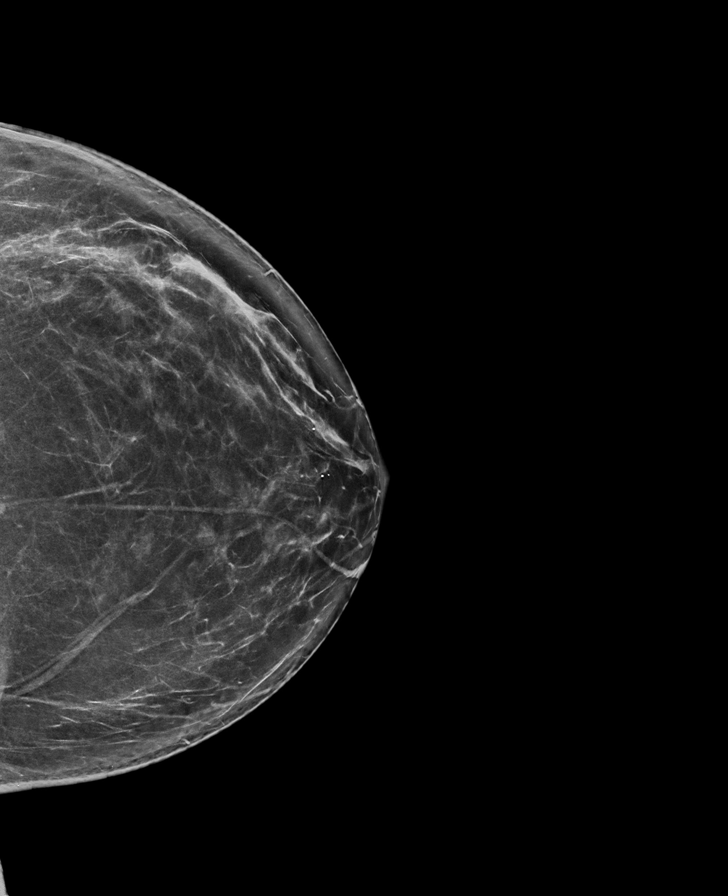

[R CC tomo · tomo slice 38/75.0]
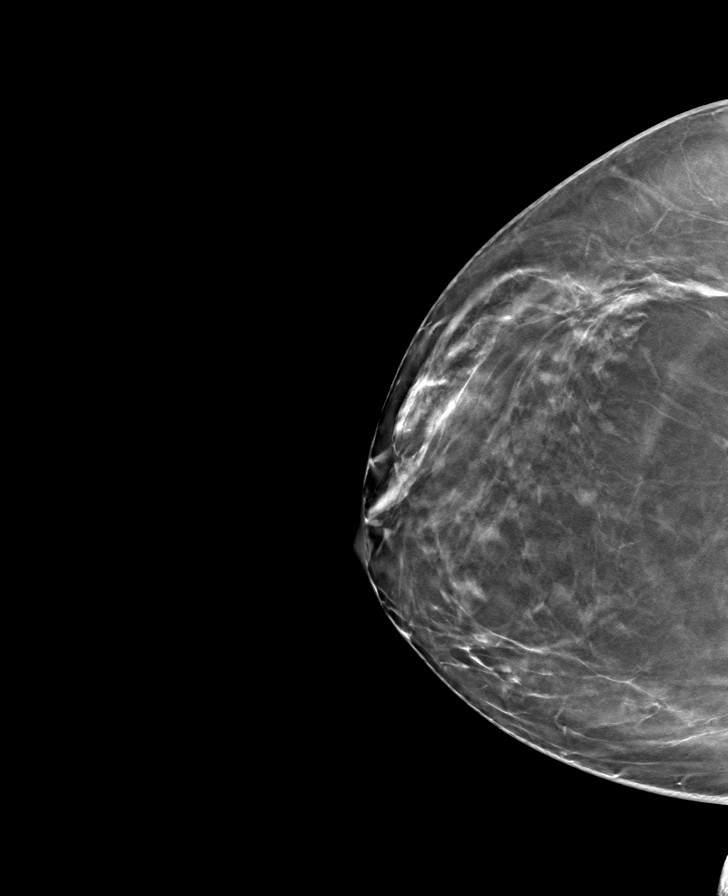

[L CC tomo · tomo slice 41/81.0]
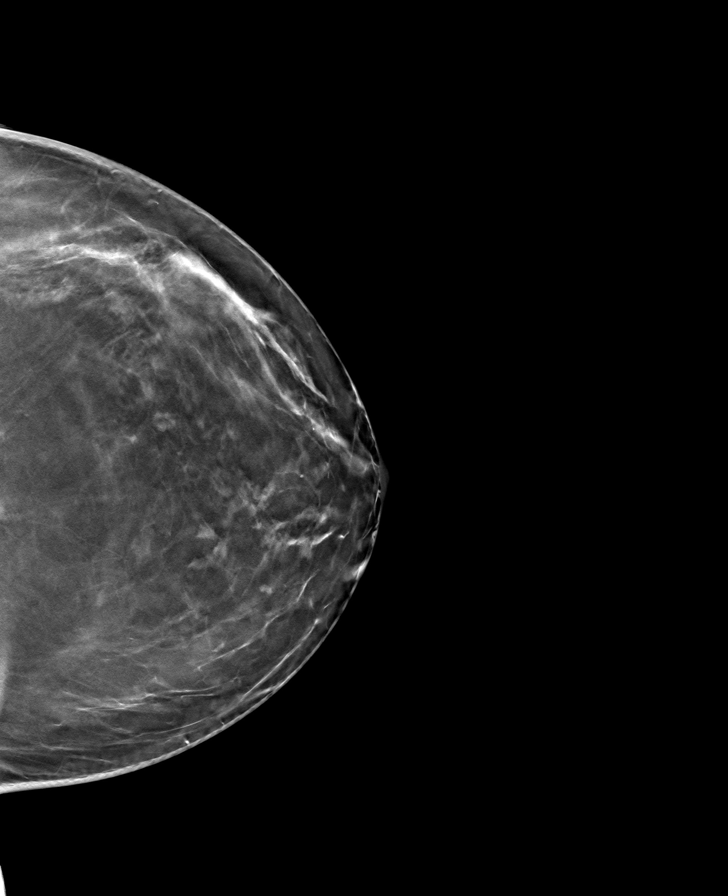

[L MLO tomo · tomo slice 41/81.0]
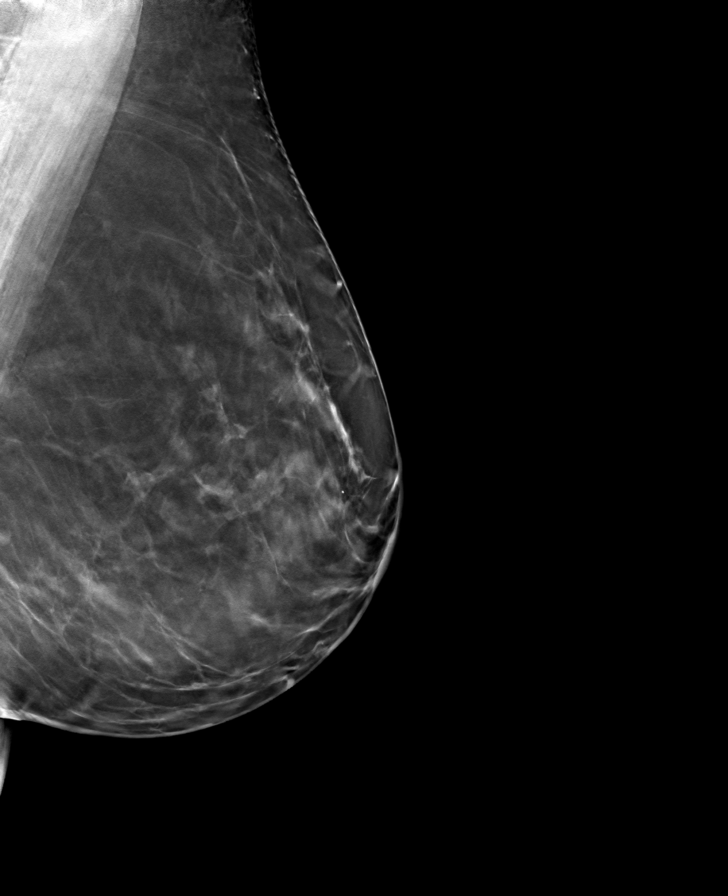

[R MLO tomo · tomo slice 41/81.0]
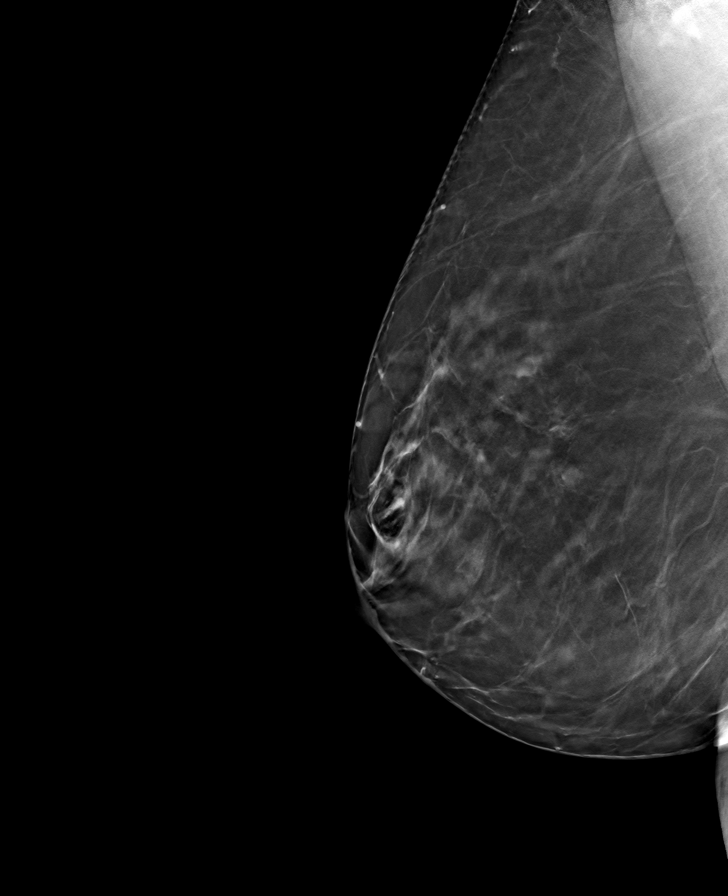

[8 of 24 positions shown; findings below may reference images not displayed]

ACR Breast Density Category b: There are scattered areas of
fibroglandular density.
FINDINGS: In the right breast, a possible mass warrants further evaluation. In
the left breast, no findings suspicious for malignancy. Images were
processed with CAD.
IMPRESSION: Further evaluation is suggested for possible mass in the right
breast.

RECOMMENDATION:
Diagnostic mammogram and possibly ultrasound of the right breast.
(Code:[MT])

The patient will be contacted regarding the findings, and additional
imaging will be scheduled.

BI-RADS CATEGORY  0: Incomplete. Need additional imaging evaluation
and/or prior mammograms for comparison.

## 2018-07-19 ENCOUNTER — Other Ambulatory Visit: Payer: Self-pay | Admitting: Obstetrics and Gynecology

## 2018-07-19 DIAGNOSIS — R928 Other abnormal and inconclusive findings on diagnostic imaging of breast: Secondary | ICD-10-CM

## 2018-07-21 ENCOUNTER — Ambulatory Visit
Admission: RE | Admit: 2018-07-21 | Discharge: 2018-07-21 | Disposition: A | Discharge status: Home / Self Care | Payer: PRIVATE HEALTH INSURANCE | Source: Ambulatory Visit | Attending: Obstetrics and Gynecology | Admitting: Obstetrics and Gynecology

## 2018-07-21 ENCOUNTER — Other Ambulatory Visit: Payer: Self-pay | Admitting: Obstetrics and Gynecology

## 2018-07-21 DIAGNOSIS — R928 Other abnormal and inconclusive findings on diagnostic imaging of breast: Secondary | ICD-10-CM

## 2018-07-21 DIAGNOSIS — N6310 Unspecified lump in the right breast, unspecified quadrant: Secondary | ICD-10-CM

## 2018-07-21 IMAGING — US ULTRASOUND RIGHT BREAST LIMITED
1 series · 1 of 1 positions shown · non-contrast
Comparison: Previous exam(s).

CLINICAL DATA: The patient was called back for a right breast mass.

EXAM:
DIGITAL DIAGNOSTIC RIGHT MAMMOGRAM WITH TOMO
ULTRASOUND RIGHT BREAST

[Series 1: ultrasound right breast limited · 0.06mm/px · 1 of 1 slices shown]
[im 1/1]
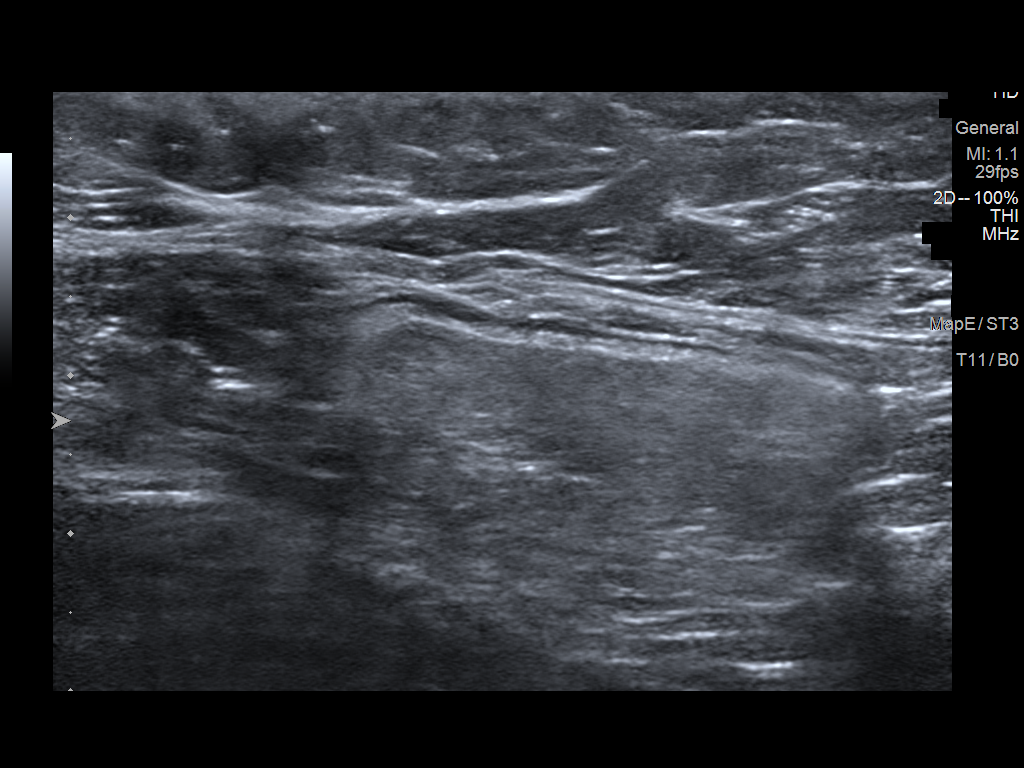

[1 of 1 positions shown; findings below may reference images not displayed]

ACR Breast Density Category b: There are scattered areas of
fibroglandular density.
FINDINGS: There is a new 5 mm low-density mass in the central right breast
seen on the CC and MLO views. No other suspicious findings in the
right breast.

On physical exam, no suspicious lumps are identified.

Targeted ultrasound is performed, showing no sonographic correlate
to the new right breast mass.
IMPRESSION: Indeterminate new right breast mass with no sonographic correlate.

RECOMMENDATION:
Stereotactic biopsy of the new right breast mass.

I have discussed the findings and recommendations with the patient.
Results were also provided in writing at the conclusion of the
visit. If applicable, a reminder letter will be sent to the patient
regarding the next appointment.

BI-RADS CATEGORY  4: Suspicious.

## 2018-07-21 IMAGING — MG DIGITAL DIAGNOSTIC UNILATERAL RIGHT MAMMOGRAM WITH TOMO AND CAD
4 series · 4 of 12 positions shown · non-contrast
Comparison: Previous exam(s).

CLINICAL DATA: The patient was called back for a right breast mass.

EXAM:
DIGITAL DIAGNOSTIC RIGHT MAMMOGRAM WITH TOMO
ULTRASOUND RIGHT BREAST

[R MLO synth-2D]
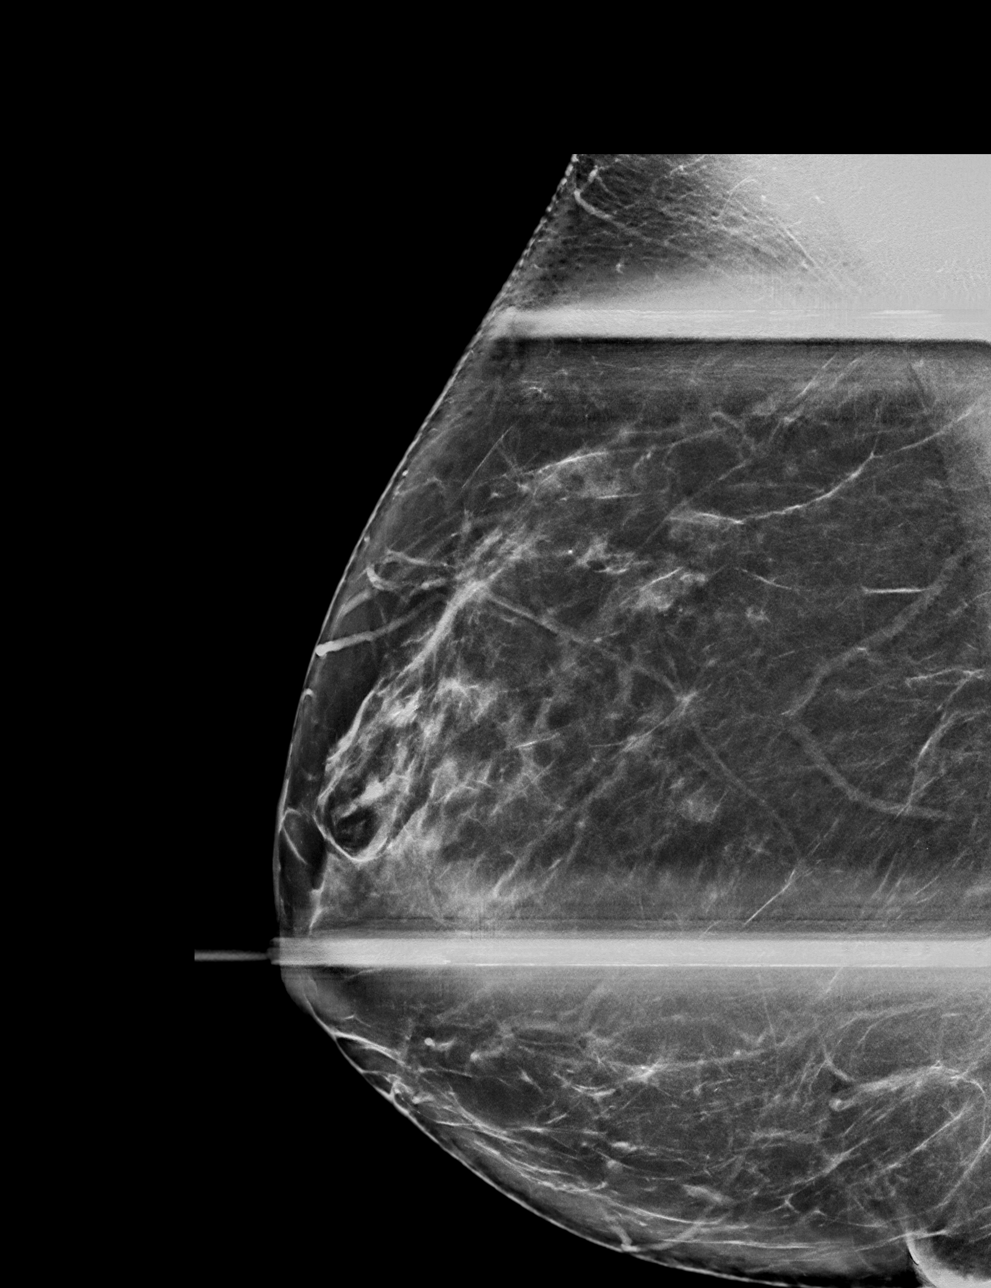

[R CC synth-2D]
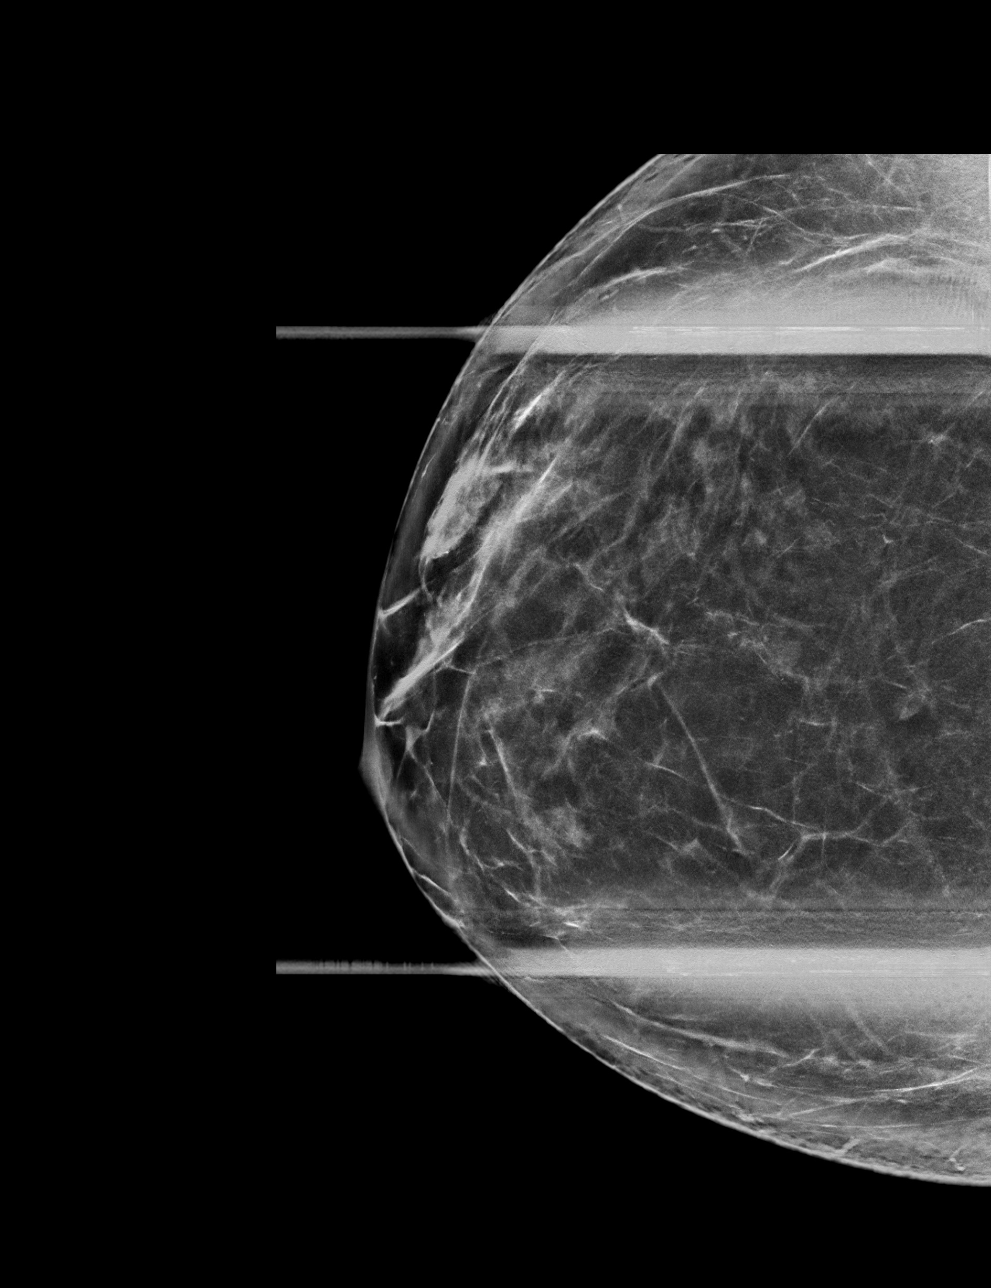

[R MLO tomo · tomo slice 43/85.0]
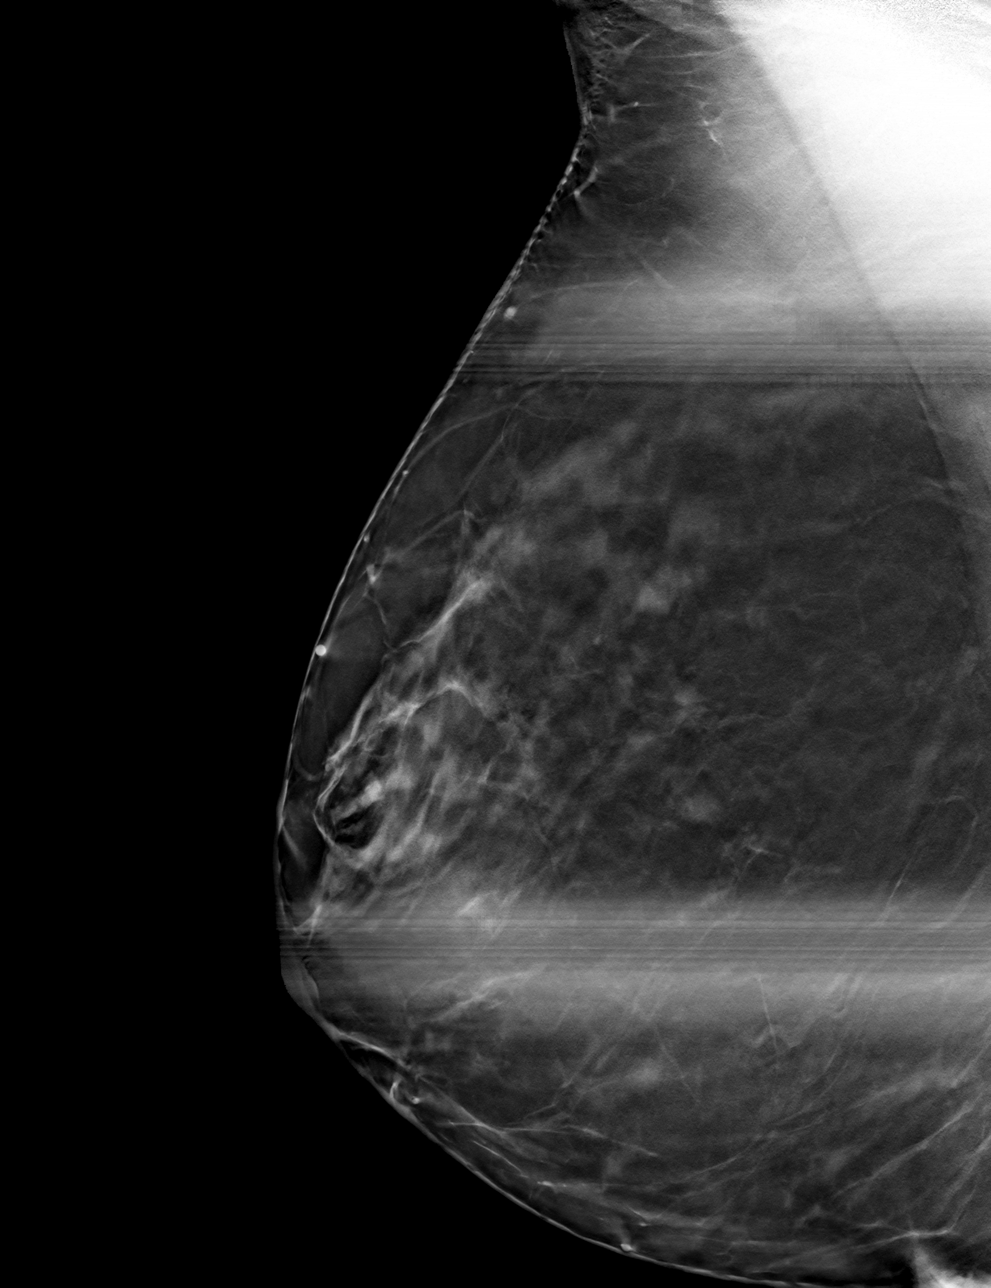

[R CC tomo · tomo slice 40/79.0]
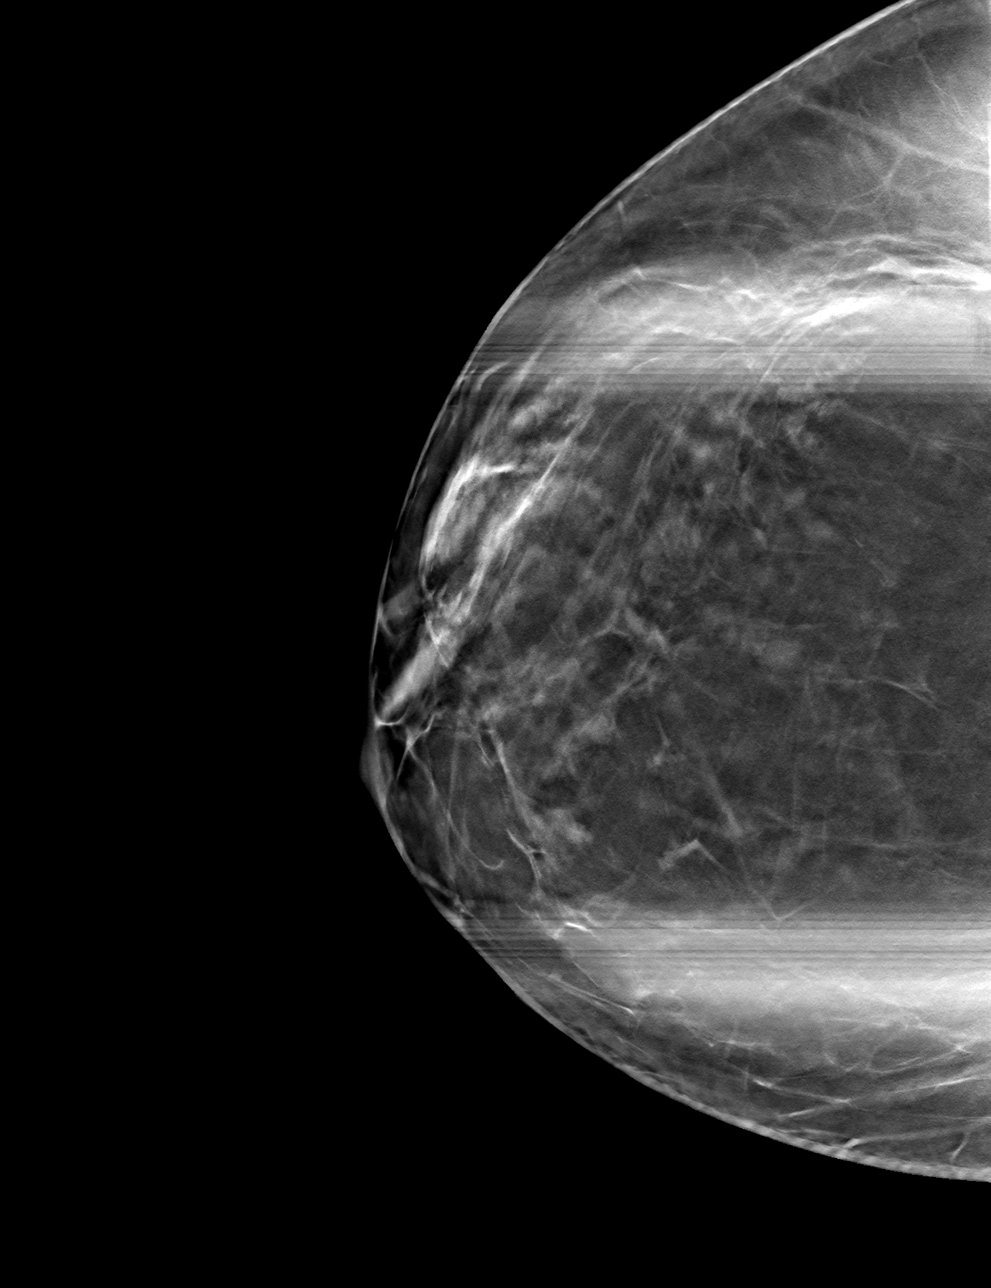

[4 of 12 positions shown; findings below may reference images not displayed]

ACR Breast Density Category b: There are scattered areas of
fibroglandular density.
FINDINGS: There is a new 5 mm low-density mass in the central right breast
seen on the CC and MLO views. No other suspicious findings in the
right breast.

On physical exam, no suspicious lumps are identified.

Targeted ultrasound is performed, showing no sonographic correlate
to the new right breast mass.
IMPRESSION: Indeterminate new right breast mass with no sonographic correlate.

RECOMMENDATION:
Stereotactic biopsy of the new right breast mass.

I have discussed the findings and recommendations with the patient.
Results were also provided in writing at the conclusion of the
visit. If applicable, a reminder letter will be sent to the patient
regarding the next appointment.

BI-RADS CATEGORY  4: Suspicious.

## 2018-07-27 ENCOUNTER — Ambulatory Visit
Admission: RE | Admit: 2018-07-27 | Discharge: 2018-07-27 | Disposition: A | Discharge status: Home / Self Care | Payer: PRIVATE HEALTH INSURANCE | Source: Ambulatory Visit | Attending: Obstetrics and Gynecology | Admitting: Obstetrics and Gynecology

## 2018-07-27 DIAGNOSIS — N6310 Unspecified lump in the right breast, unspecified quadrant: Secondary | ICD-10-CM

## 2018-07-27 IMAGING — MG MM CLIP PLACEMENT
4 series · 4 of 12 positions shown · non-contrast
Comparison: Previous exam(s).

CLINICAL DATA: Patient status post stereotactic guided core needle
biopsy right breast mass.

EXAM:
DIAGNOSTIC RIGHT MAMMOGRAM POST STEREOTACTIC BIOPSY

[R ML synth-2D]
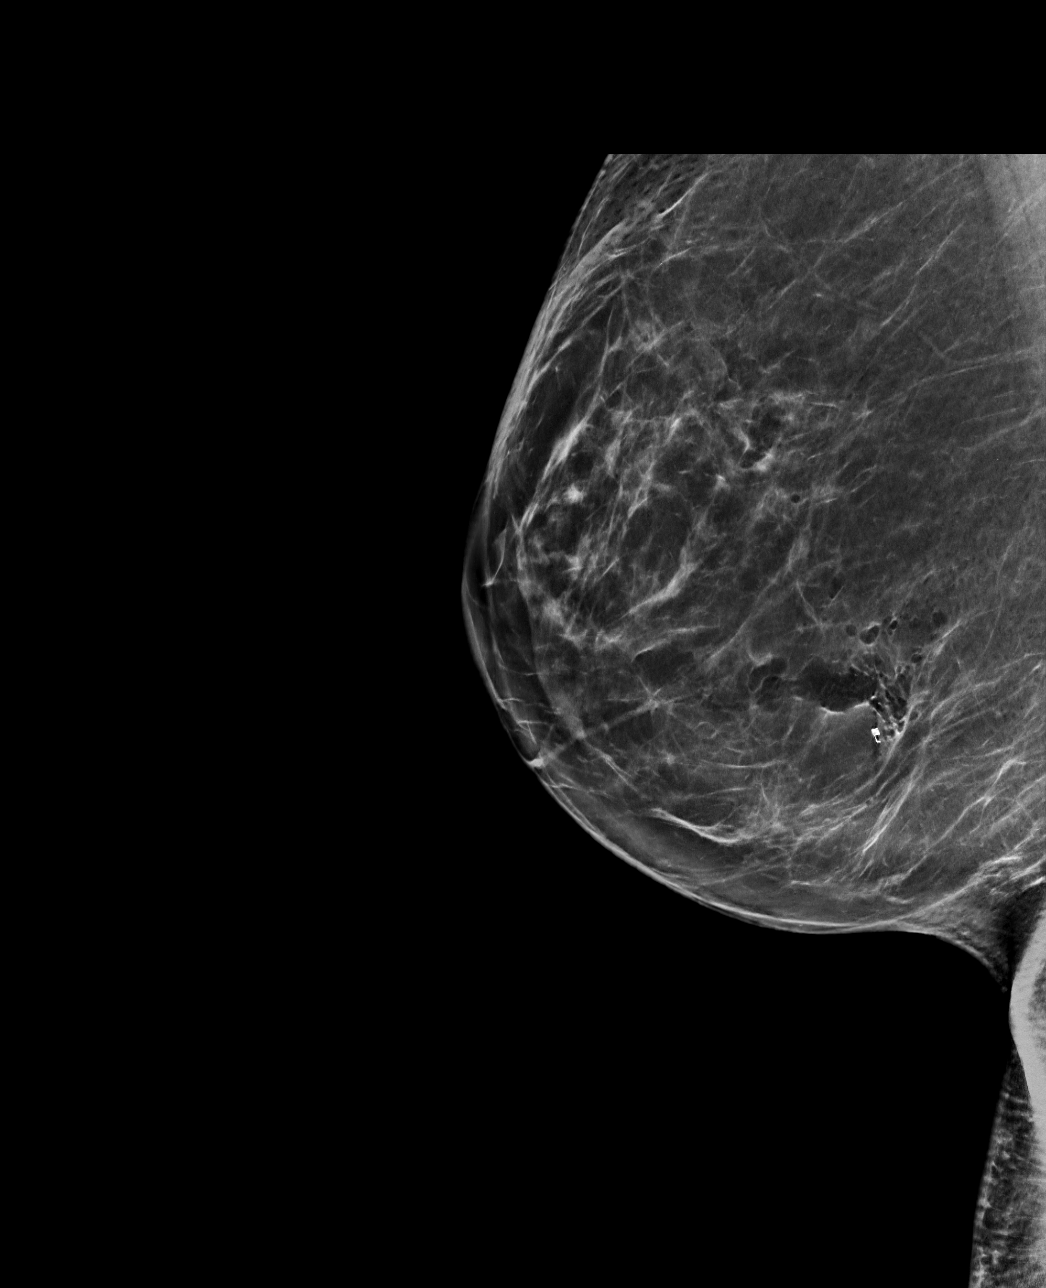

[R CC synth-2D]
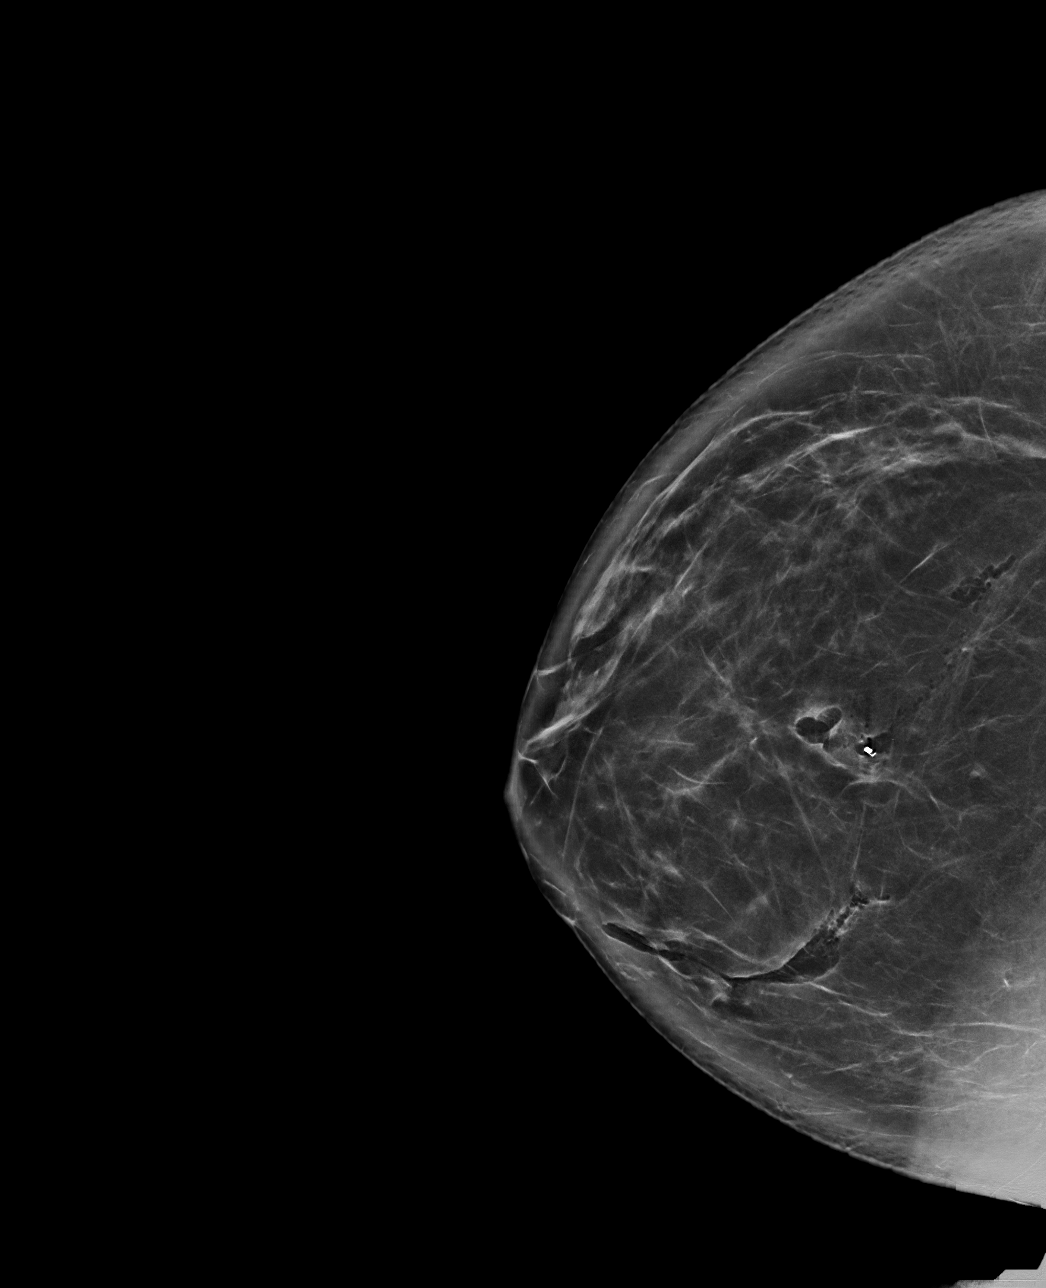

[R ML tomo · tomo slice 44/87.0]
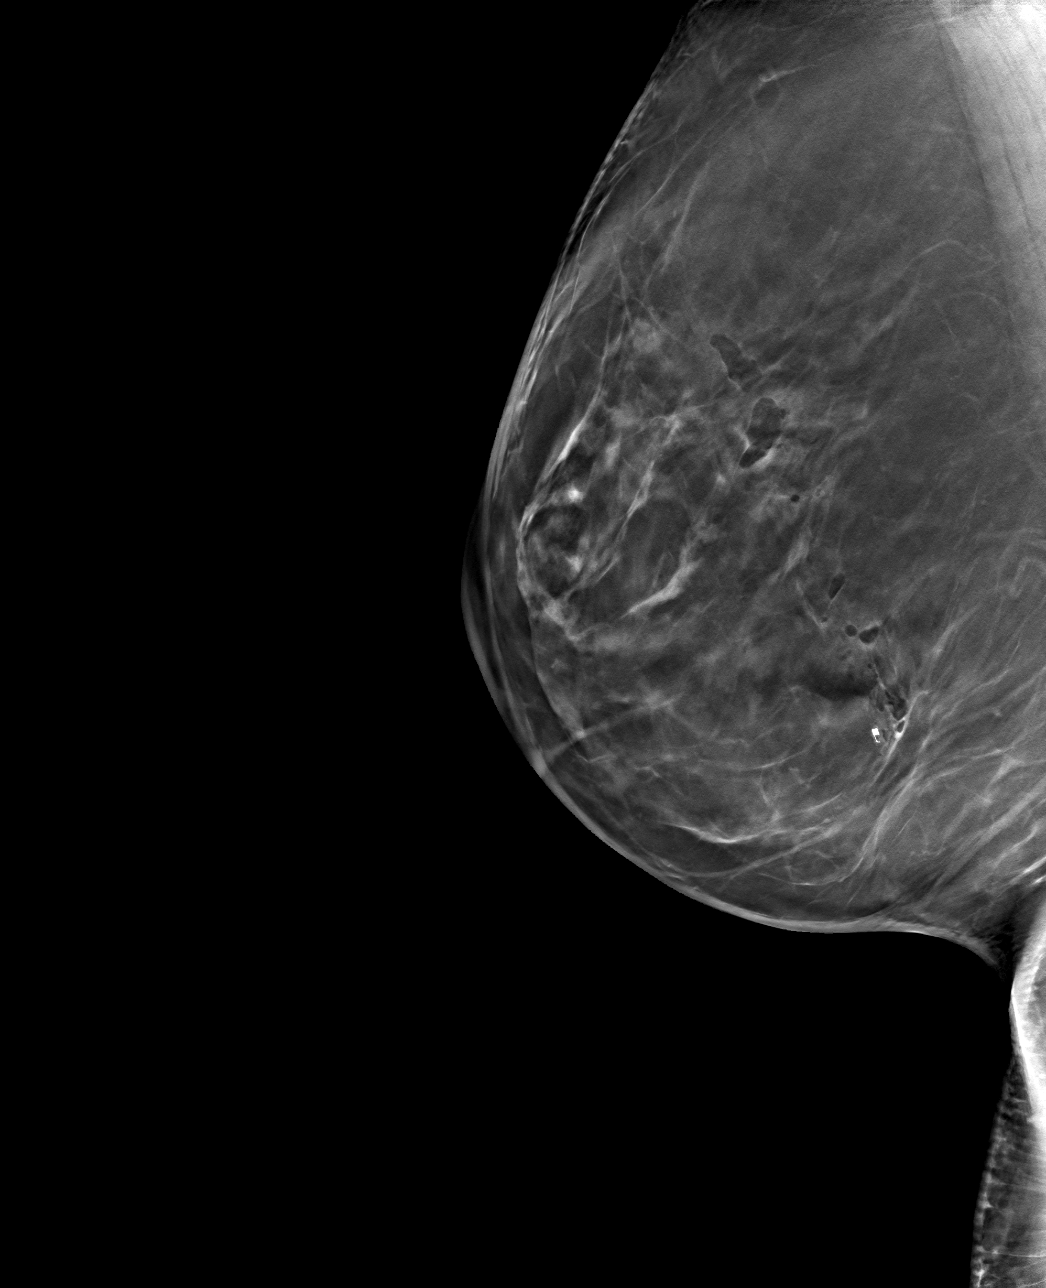

[R CC tomo · tomo slice 45/88.0]
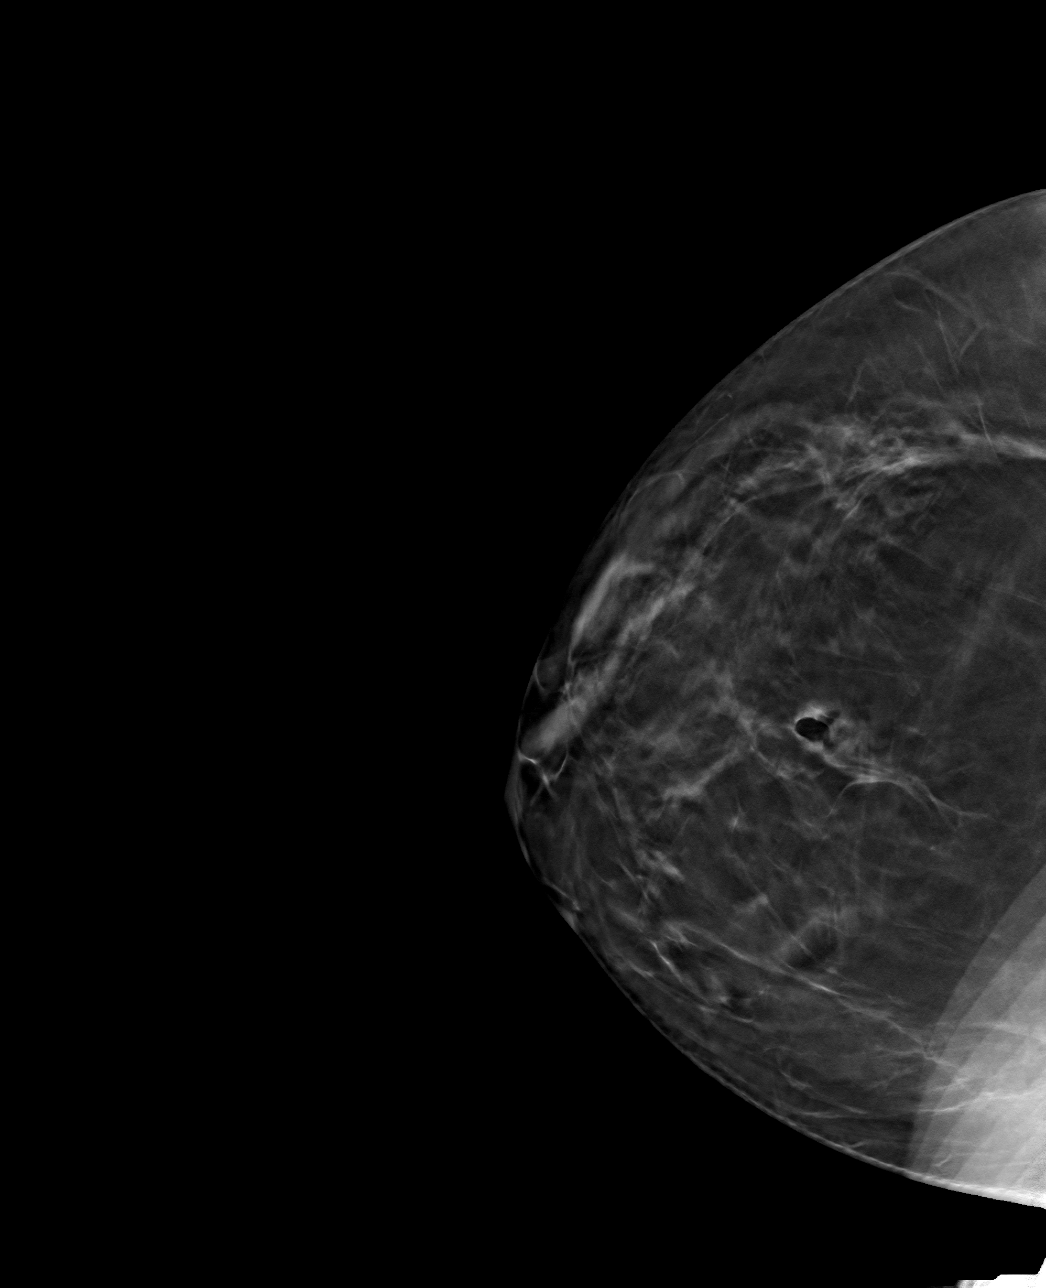

[4 of 12 positions shown; findings below may reference images not displayed]

FINDINGS: Mammographic images were obtained following stereotactic guided
biopsy of right breast mass. Coil shaped marking clip located
approximately 1.5 cm inferior to the biopsy site.
IMPRESSION: Approximate 1.5 cm inferior migration coil shaped marking clip
relative to the biopsy site.

Final Assessment: Post Procedure Mammograms for Marker Placement

## 2018-07-27 IMAGING — MG STEREOTACTIC VACUUM ASSIST RIGHT
6 of 9 series · 6 of 21 positions shown · non-contrast
Comparison: Previous exams.

Addendum:
CLINICAL DATA: Indeterminate right breast mass.

EXAM:
RIGHT BREAST STEREOTACTIC CORE NEEDLE BIOPSY

[R (1 of 5)]
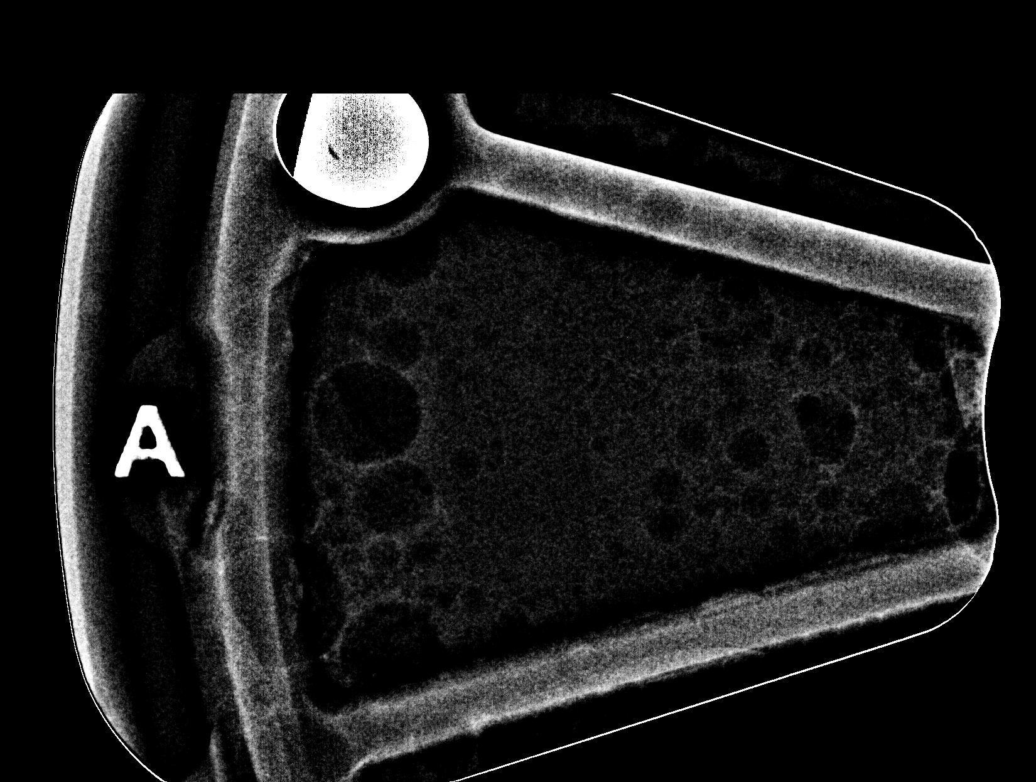

[R (2 of 5)]
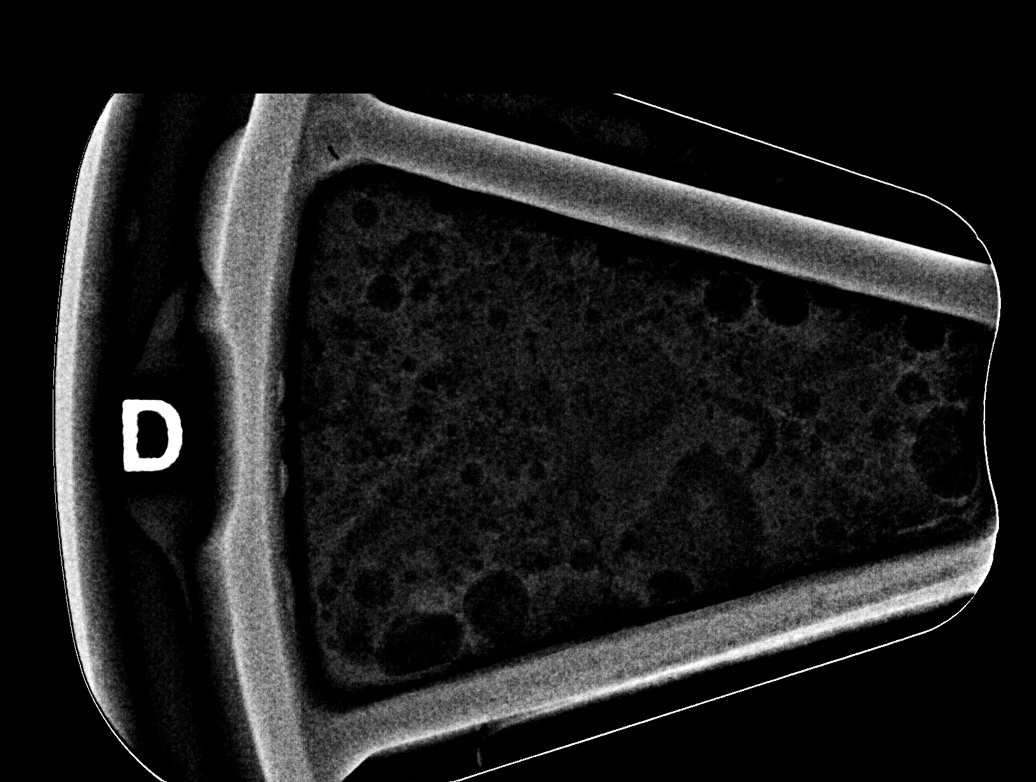

[R (3 of 5)]
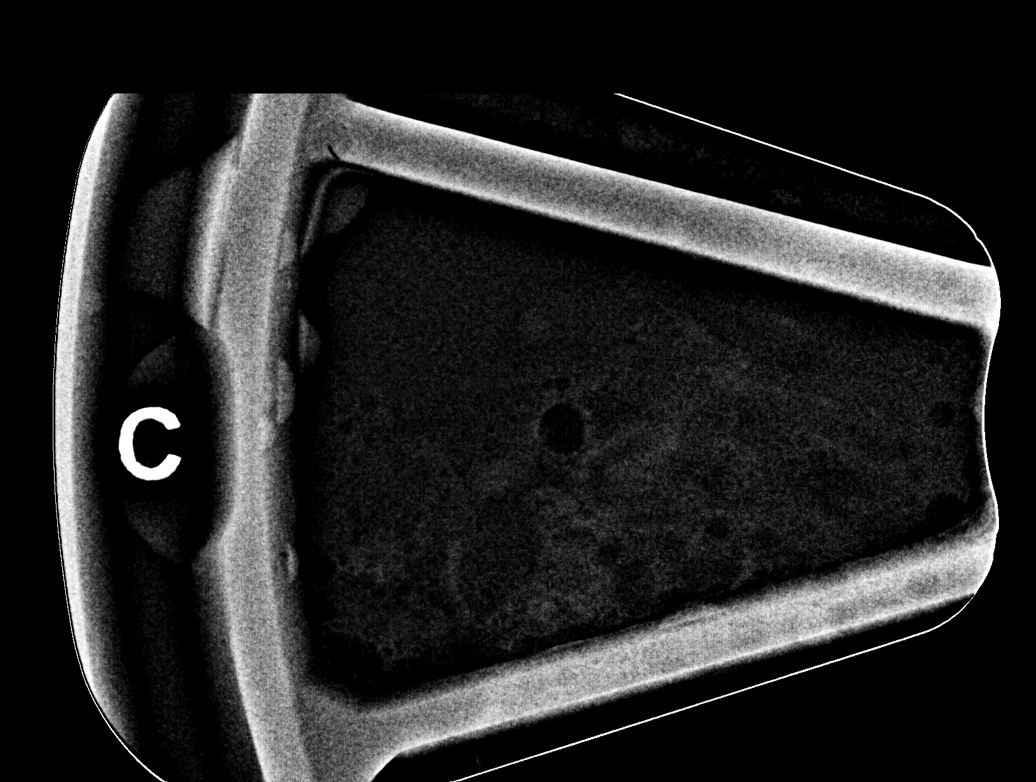

[R (4 of 5)]
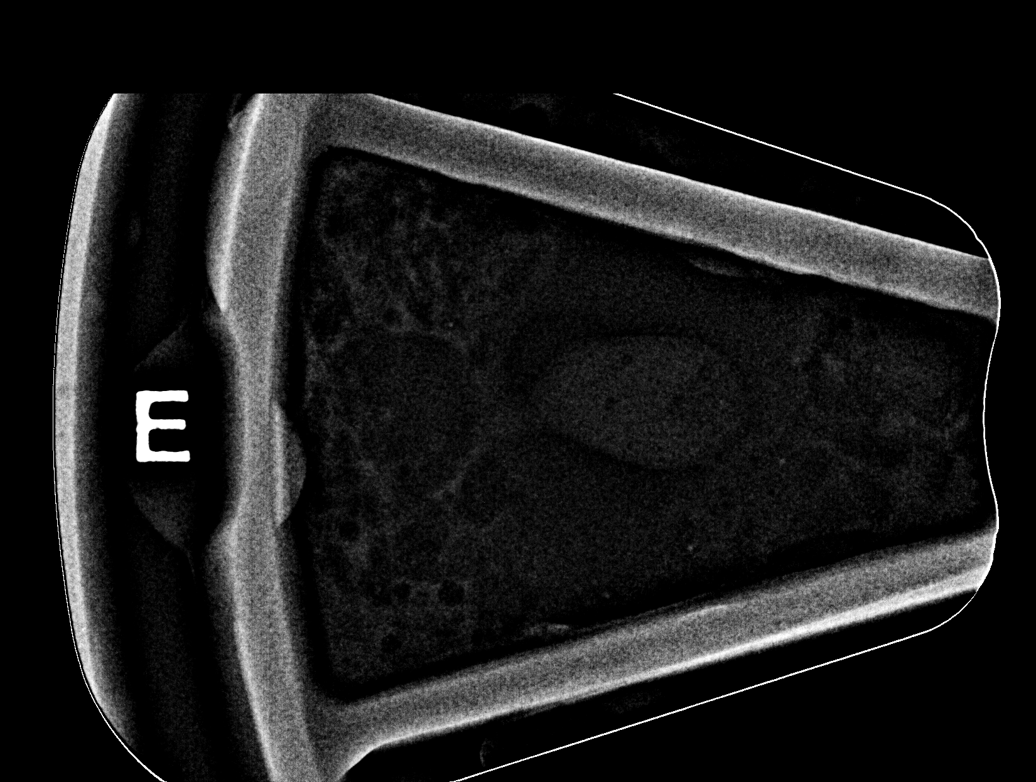

[R (5 of 5)]
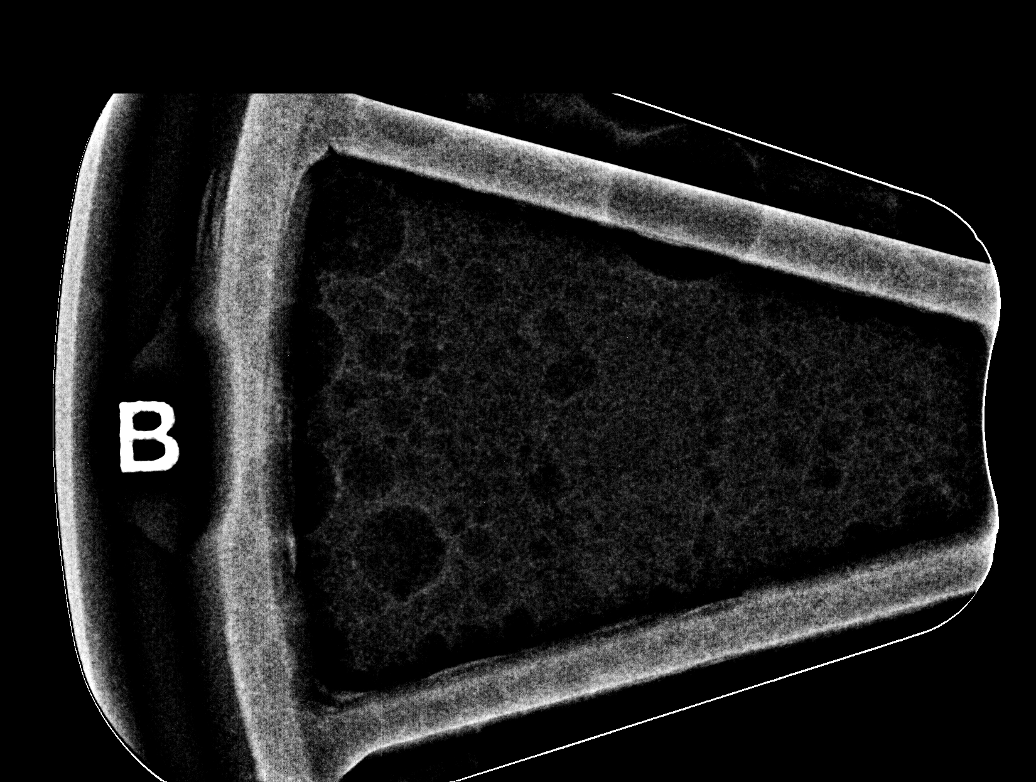

[R CC]
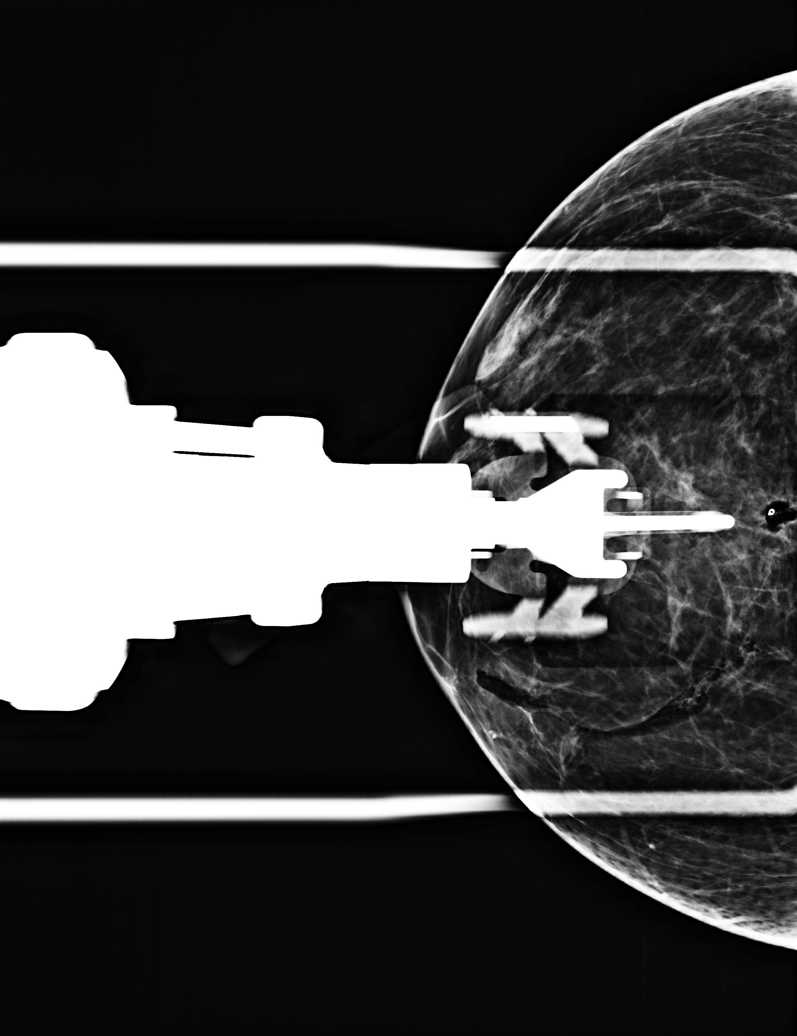

[6 of 21 positions shown; findings below may reference images not displayed]



Using sterile technique and 1% Lidocaine as local anesthetic, under
stereotactic guidance, a 9 gauge vacuum assisted device was used to
perform core needle biopsy of mass within the inferior right breast
using a cranial approach.

Lesion quadrant: Lower outer quadrant

At the conclusion of the procedure, a coil shaped tissue marker clip
was deployed into the biopsy cavity. Follow-up 2-view mammogram was
performed and dictated separately.
IMPRESSION: Stereotactic-guided biopsy of right breast mass. No apparent
complications.

ADDENDUM:
Pathology revealed FIBROCYSTIC CHANGES of the RIGHT breast,
inferior. This was found to be concordant by Dr. TOLAND.

Pathology results were discussed with the patient by telephone. The
patient reported doing well after the biopsy with tenderness at the
site. Post biopsy instructions and care were reviewed and questions
were answered. The patient was encouraged to call The [REDACTED]

The patient was asked to return for right diagnostic mammography and
ultrasound in 6 months and informed a reminder notice would be sent
regarding this appointment.

Pathology results reported by TOLAND, RN on [DATE].

*** End of Addendum ***

## 2018-12-29 ENCOUNTER — Other Ambulatory Visit: Payer: Self-pay | Admitting: Internal Medicine

## 2018-12-29 DIAGNOSIS — Z20822 Contact with and (suspected) exposure to covid-19: Secondary | ICD-10-CM

## 2019-01-03 LAB — NOVEL CORONAVIRUS, NAA: SARS-CoV-2, NAA: NOT DETECTED

## 2019-01-31 ENCOUNTER — Other Ambulatory Visit: Payer: Self-pay | Admitting: Obstetrics and Gynecology

## 2019-01-31 DIAGNOSIS — Z803 Family history of malignant neoplasm of breast: Secondary | ICD-10-CM

## 2019-03-01 ENCOUNTER — Ambulatory Visit
Admission: RE | Admit: 2019-03-01 | Discharge: 2019-03-01 | Disposition: A | Discharge status: Home / Self Care | Payer: PRIVATE HEALTH INSURANCE | Source: Ambulatory Visit | Attending: Obstetrics and Gynecology | Admitting: Obstetrics and Gynecology

## 2019-03-01 ENCOUNTER — Other Ambulatory Visit: Payer: Self-pay

## 2019-03-01 DIAGNOSIS — Z803 Family history of malignant neoplasm of breast: Secondary | ICD-10-CM

## 2019-03-01 IMAGING — MR MR BREAST BILAT WO/W CM
8 of 12 series · 33 of 48 positions shown · IV contrast (gadavist)
Comparison: Previous exam(s).

CLINICAL DATA: Family hx of breast cancer in mother at age 60 and
grandmother around the same age. Right biopsy [DATE] results
negative. Right breast reduction [V9]. No prior MRI.

LABS:  GFR= 82, CR=0.8
EXAM:
BILATERAL BREAST MRI WITH AND WITHOUT CONTRAST
TECHNIQUE: Multiplanar, multisequence MR images of both breasts were obtained
prior to and following the intravenous administration of 9 ml of
Gadavist

[Series 2: t2_tirm_tra ipat (a-p) · axial · 3.0mm · 0.70mm/px · 1 of 55 slices shown]
[im 1/55]
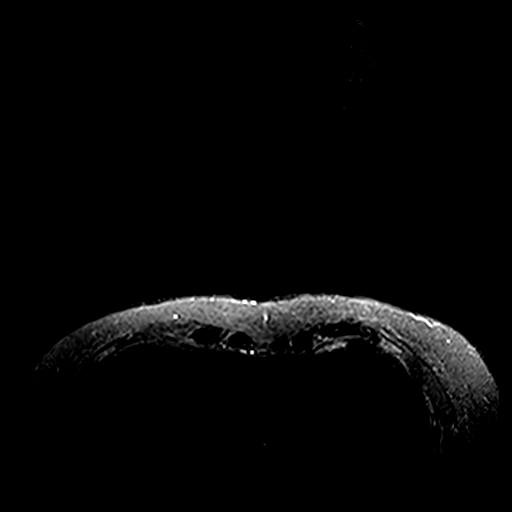

[Series 3: fl3d pre-cm no · axial · non-contrast · 1.2mm · 0.94mm/px · z∈[-32,+140]mm · 5 of 144 slices shown]
[im 1/144]
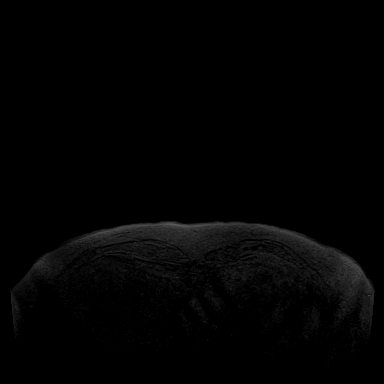
[im 36/144]
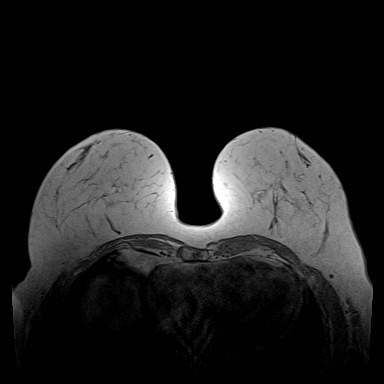
[im 72/144]
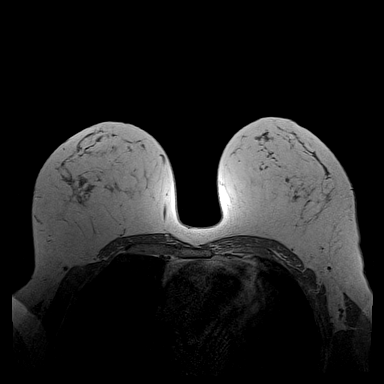
[im 108/144]
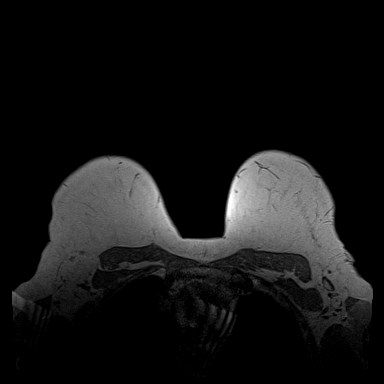
[im 144/144]
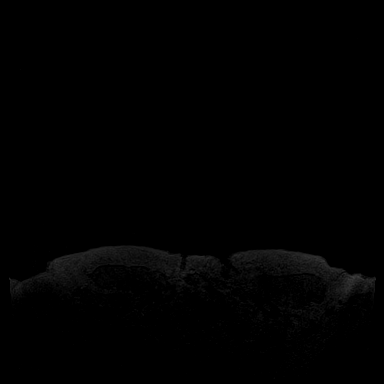

[Series 4: fl3d pre-cm · axial · non-contrast · 1.2mm · 0.94mm/px · z∈[-32,+140]mm · 5 of 144 slices shown]
[im 1/144]
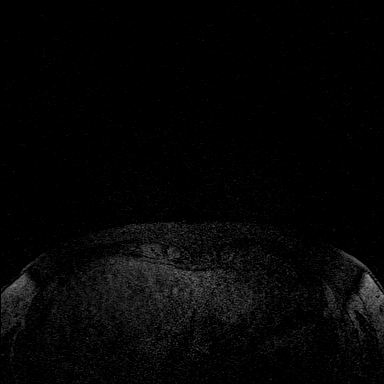
[im 36/144]
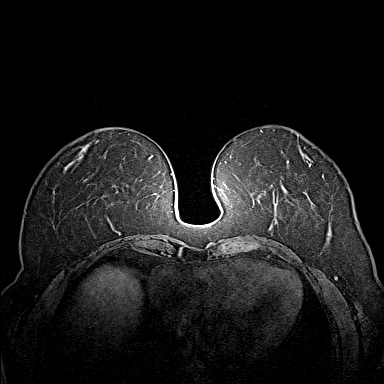
[im 72/144]
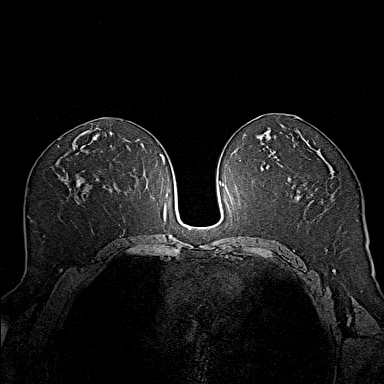
[im 108/144]
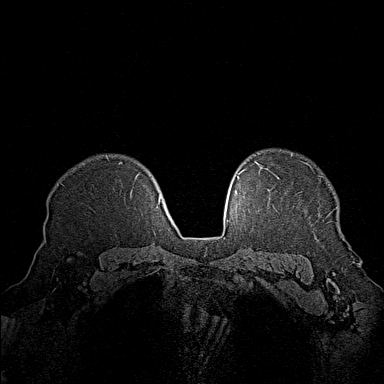
[im 144/144]
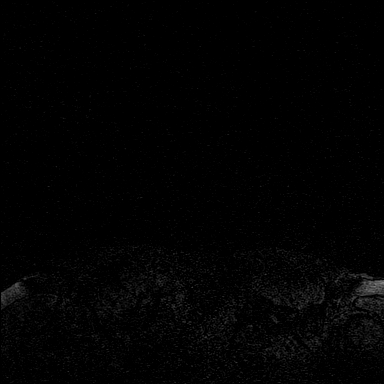

[Series 5: fl3d post-cm 20 · axial · 1.2mm · 0.94mm/px · z∈[-32,+140]mm · 5 of 144 slices shown (1 of 3)]
[im 1/144]
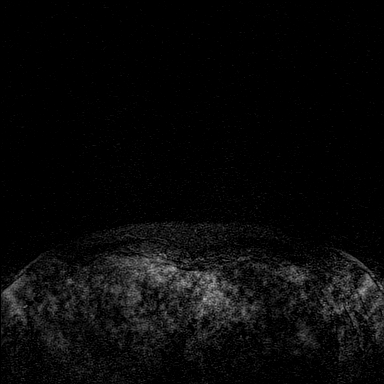
[im 36/144]
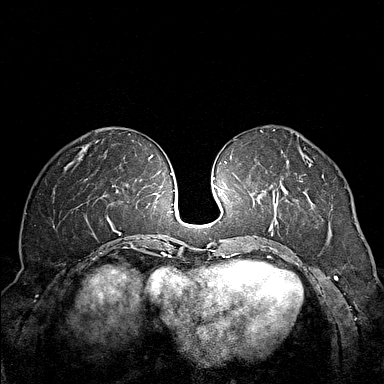
[im 72/144]
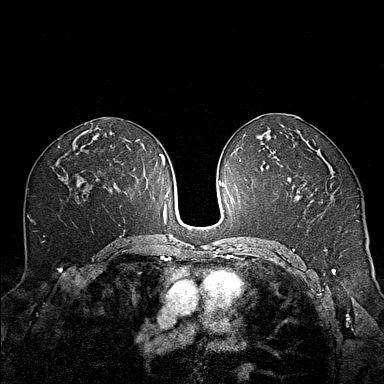
[im 108/144]
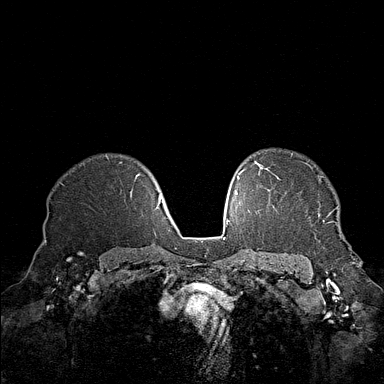
[im 144/144]
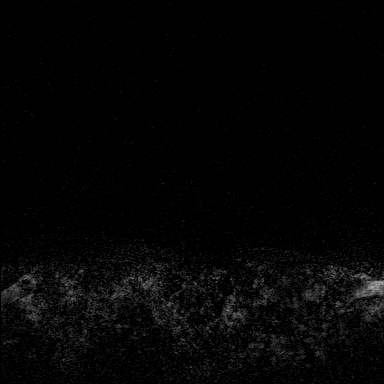

[Series 6: fl3d post-cm 20 · axial · 1.2mm · 0.94mm/px · z∈[-32,+140]mm · 5 of 144 slices shown (2 of 3)]
[im 1/144]
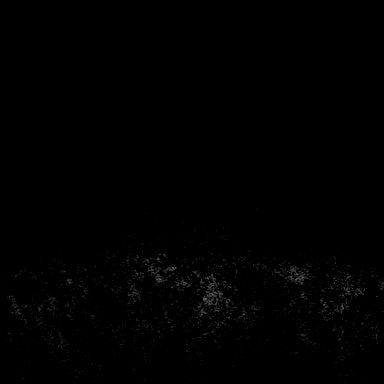
[im 36/144]
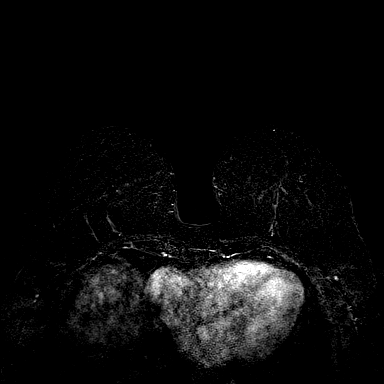
[im 72/144]
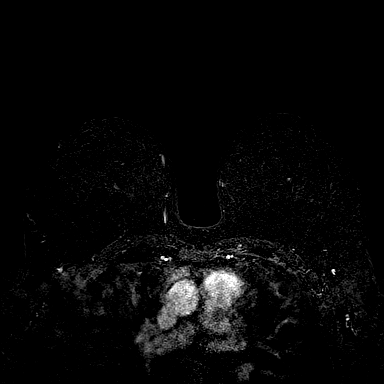
[im 108/144]
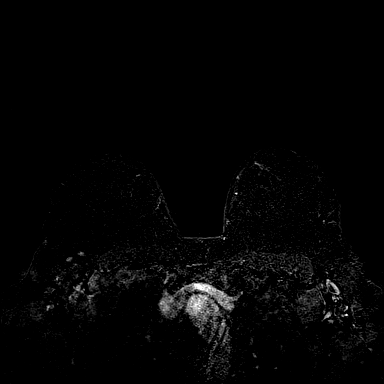
[im 144/144]
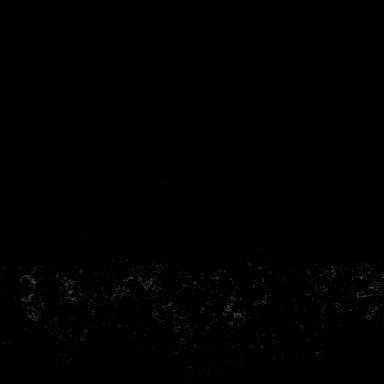

[Series 7: fl3d post-cm 20 · axial · 172.8mm · 0.94mm/px · 1 of 1 slices shown (3 of 3)]
[im 1/1]
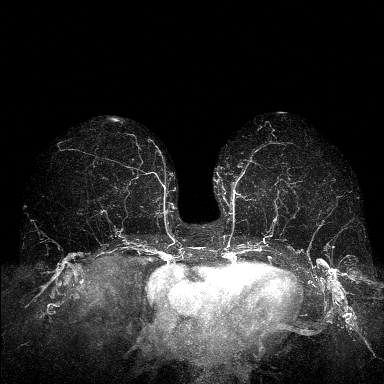

[Series 8: fl3d post-cm 3min · axial · 1.2mm · 0.94mm/px · z∈[-32,+140]mm · 6 of 144 slices shown]
[im 1/144]
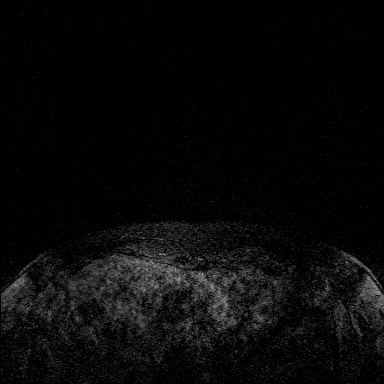
[im 29/144]
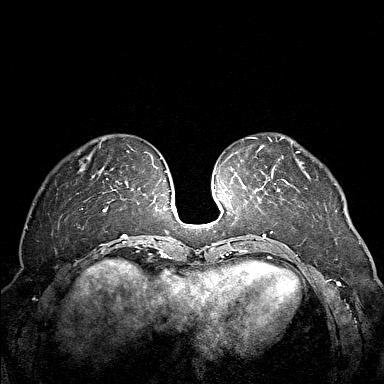
[im 58/144]
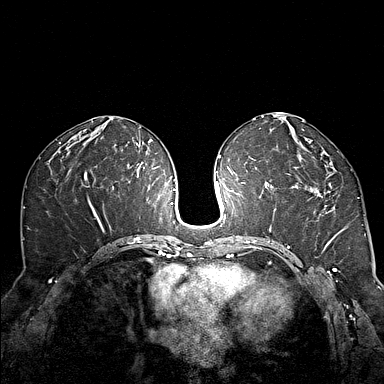
[im 86/144]
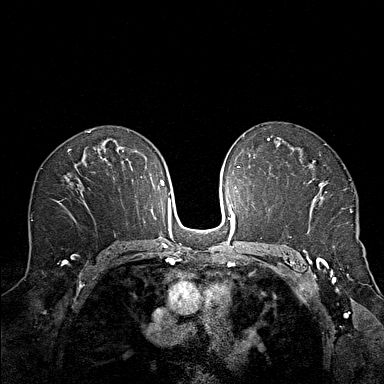
[im 115/144]
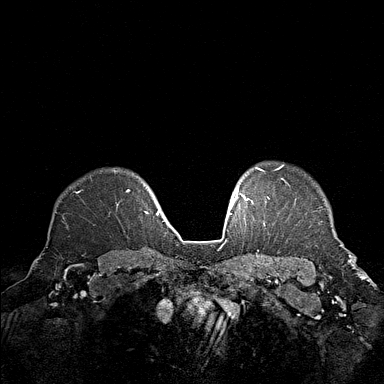
[im 144/144]
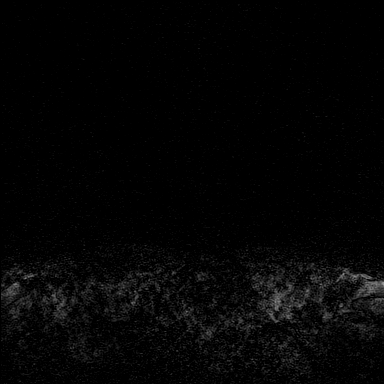

[Series 9: fl3d post-cm 3min_sub · axial · 1.2mm · 0.94mm/px · z∈[-32,+105]mm · 5 of 144 slices shown]
[im 1/144]
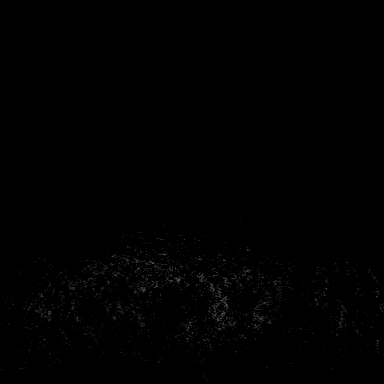
[im 29/144]
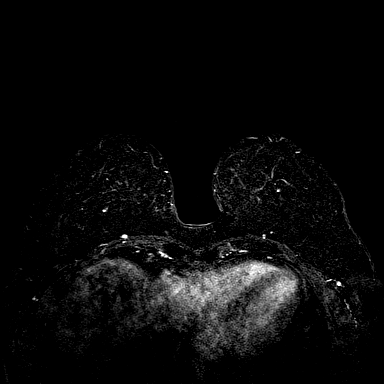
[im 58/144]
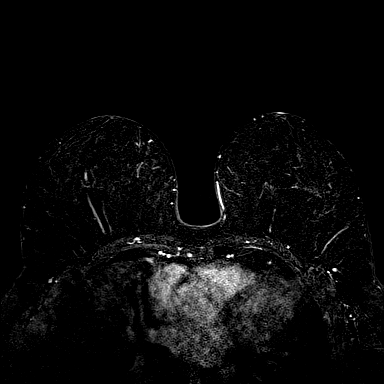
[im 86/144]
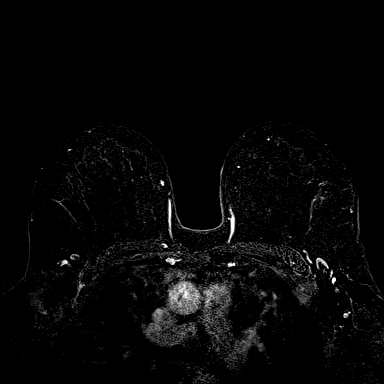
[im 115/144]
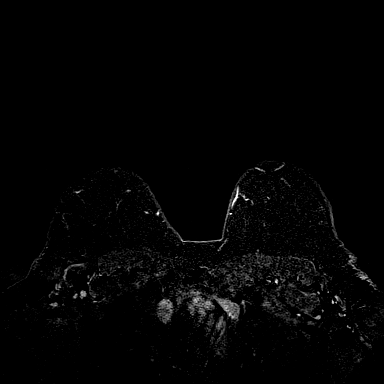

[33 of 48 positions shown; findings below may reference images not displayed]

Three-dimensional MR images were rendered by post-processing of the
original MR data on an independent workstation. The
three-dimensional MR images were interpreted, and findings are
reported in the following complete MRI report for this study. Three
dimensional images were evaluated at the independent [employer]
workstation
FINDINGS: Breast composition: b. Scattered fibroglandular tissue.

Background parenchymal enhancement: Minimal

Right breast: No mass or abnormal enhancement. There is
susceptibility artifact in the central right breast likely
corresponding to a marking clip from the patient's prior biopsy.

Left breast: No mass or abnormal enhancement.

Lymph nodes: No abnormal appearing lymph nodes.

Ancillary findings: Trace bilateral right greater than left pleural
fluid.
IMPRESSION: 1. No MRI evidence of malignancy in the bilateral breasts.
2. Trace bilateral right greater than left pleural fluid.

RECOMMENDATION:
Continue routine screening. No specific follow-up is necessary from
this exam.

BI-RADS CATEGORY  1: Negative.

## 2019-03-01 MED ORDER — GADOBUTROL 1 MMOL/ML IV SOLN
9.0000 mL | Freq: Once | INTRAVENOUS | Status: AC | PRN
  Administered 2019-03-01: 9 mL via INTRAVENOUS

## 2019-03-04 ENCOUNTER — Other Ambulatory Visit: Payer: PRIVATE HEALTH INSURANCE

## 2019-04-05 NOTE — Progress Notes (Signed)
Synopsis: Referred in October 2020 for pleural effusion by Merri Brunette, MD.  Previously a patient of Dr. Shelle Iron for obstructive sleep apnea.  Subjective:   PATIENT ID: Maria Stewart GENDER: female DOB: [birth year], MRN: [id number]  Chief Complaint  Patient presents with  . Consult    Maria Stewart is a 58 year old woman referred for evaluation of pleural effusions demonstrated on a breast MRI was performed to follow-up after benign breast biopsy.  She has no previous history of heart, liver, kidney, lung, or rheumatologic disease.  No family history of rheumatologic disease.  She has a family history of breast cancer and melanoma in her mother and renal cancer, prostate, and liver cancer in her father.  She is up-to-date on cancer screenings-mammography, Pap smear, colonoscopy.  She is a never smoker/ vaper.  She denies dyspnea on exertion, cough, sputum production, chest pain, weight loss, change in appetite, rashes, synovitis, or other new symptoms.  She regularly walks with her dog 1 mile per day.   She is a history of obstructive sleep apnea. She was prescribed CPAP several years ago, but always had trouble with mask fit, which she attributes to having had 2 jaw surgeries when she was younger.  She has tried appliance therapy, which has not worked for her.  She last wore her CPAP about 4 years ago.  She has gained some weight since that time.  She would like to have a new mask and tubing for her CPAP as her old ones are several years old.  She is interested in restarting using her CPAP for her sleep apnea.       Past Medical History:  Diagnosis Date  . Hypertension   . OSA on CPAP   . Overactive bladder   . Seasonal allergies      Family History  Problem Relation Age of Onset  . Thyroid disease Mother   . Breast cancer Mother   . Melanoma Mother   . Prostate cancer Father   . Kidney cancer Father   . Liver cancer Father      Past Surgical History:  Procedure Laterality  Date  . ABDOMINAL HYSTERECTOMY  12/2001  . BILATERAL TEMPOROMANDIBULAR JOINT ARTHROPLASTY     05/1989  Mandibular   03/1993 Maxillary  . CESAREAN SECTION  2001  . MANDIBLE SURGERY     x2 when younger  . NASAL SINUS SURGERY  04/2011  . REDUCTION MAMMAPLASTY Bilateral    many years ago  . TUBAL LIGATION  03/2000    Social History   Socioeconomic History  . Marital status: Married    Spouse name: Not on file  . Number of children: 3  . Years of education: Not on file  . Highest education level: Not on file  Occupational History  . 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 Financial resource strain: Not on file  . Food insecurity    Worry: Not on file    Inability: Not on file  . Transportation needs    Medical: Not on file    Non-medical: Not on file  Tobacco Use  . Smoking status: Never Smoker  . Smokeless tobacco: Never Used  Substance and Sexual Activity  . Alcohol use: Yes    Comment: occassional   1-2x years  . Drug use: No  . Sexual activity: Yes    Partners: Female  Lifestyle  . Physical activity    Days per week: Not  on file    Minutes per session: Not on file  . Stress: Not on file  Relationships  . Social Herbalist on phone: Not on file    Gets together: Not on file    Attends religious service: Not on file    Active member of club or organization: Not on file    Attends meetings of clubs or organizations: Not on file    Relationship status: Not on file  . Intimate partner violence    Fear of current or ex partner: Not on file    Emotionally abused: Not on file    Physically abused: Not on file    Forced sexual activity: Not on file  Other Topics Concern  . Not on file  Social History Narrative  . Not on file     No Known Allergies   Immunization History  Administered Date(s) Administered  . Influenza Split 03/29/2012  . Influenza,inj,Quad PF,6+ Mos 04/06/2019   . Influenza-Unspecified 03/15/2014    Outpatient Medications Prior to Visit  Medication Sig Dispense Refill  . escitalopram (LEXAPRO) 5 MG tablet TAKE 1 TABLET ONCE A DAY ORALLY 90 DAYS    . losartan (COZAAR) 100 MG tablet Take 1 tablet by mouth daily.    Marland Kitchen MYRBETRIQ 25 MG TB24 tablet Take 25 mg by mouth daily.  12  . WELLBUTRIN XL 300 MG 24 hr tablet Take 1 tablet by mouth daily.     No facility-administered medications prior to visit.     Review of Systems  Constitutional: Negative for chills, diaphoresis, fever, malaise/fatigue and weight loss.  HENT: Negative for congestion, ear pain and sore throat.   Eyes: Negative.   Respiratory: Negative for cough, hemoptysis, sputum production, shortness of breath and wheezing.   Cardiovascular: Negative for chest pain, palpitations and leg swelling.  Gastrointestinal: Positive for heartburn. Negative for abdominal pain and nausea.  Genitourinary: Negative for frequency.  Musculoskeletal: Negative for joint pain and myalgias.  Skin: Negative for itching and rash.  Neurological: Negative for dizziness, weakness and headaches.  Endo/Heme/Allergies: Does not bruise/bleed easily.  Psychiatric/Behavioral: Negative for depression. The patient is not nervous/anxious.      Objective:   Vitals:   04/06/19 0911  BP: 140/82  Pulse: 81  Temp: 97.6 F (36.4 C)  TempSrc: Temporal  SpO2: 98%  Weight: 199 lb 9.6 oz (90.5 kg)  Height: 5' 4.5" (1.638 m)   98% on   RA BMI Readings from Last 3 Encounters:  04/06/19 33.73 kg/m  07/02/14 30.42 kg/m  02/28/13 30.22 kg/m   Wt Readings from Last 3 Encounters:  04/06/19 199 lb 9.6 oz (90.5 kg)  07/02/14 182 lb 12.8 oz (82.9 kg)  02/28/13 181 lb 9.6 oz (82.4 kg)    Physical Exam Vitals signs reviewed.  Constitutional:      Appearance: Normal appearance. She is not ill-appearing or diaphoretic.  HENT:     Head: Normocephalic and atraumatic.     Nose:     Comments: Deferred due to masking  requirement.    Mouth/Throat:     Comments: Deferred due to masking requirement. Eyes:     General: No scleral icterus. Neck:     Musculoskeletal: Neck supple.  Cardiovascular:     Rate and Rhythm: Normal rate and regular rhythm.     Heart sounds: No murmur.  Pulmonary:     Comments: Breathing comfortably on room air, no conversational dyspnea.  Clear to auscultation bilaterally. Abdominal:  General: There is no distension.     Palpations: Abdomen is soft.     Tenderness: There is no abdominal tenderness.  Musculoskeletal:        General: No swelling or deformity.     Comments: No synovitis  Lymphadenopathy:     Cervical: No cervical adenopathy.  Skin:    General: Skin is warm and dry.     Findings: No rash.  Neurological:     Mental Status: She is alert.     Motor: No weakness.     Coordination: Coordination normal.  Psychiatric:        Mood and Affect: Mood normal.        Behavior: Behavior normal.      CBC    Component Value Date/Time   WBC 7.5 01/02/2010 1353   RBC 4.32 01/02/2010 1353   HGB 13.6 01/02/2010 1353   HCT 40.1 01/02/2010 1353   PLT 278 01/02/2010 1353   MCV 92.9 01/02/2010 1353   MCH 31.5 01/02/2010 1353   MCHC 33.9 01/02/2010 1353   RDW 12.7 01/02/2010 1353    No previous BNP or proBNP levels  Chest Imaging- films reviewed: MRI breast 03/01/2019-cardiomegaly, unable to view the posterior lungs or evaluate for dependent pleural effusions.  Per report there is trace right greater than left pleural fluid.  CXR, 2-view 04/06/19- no definite pleural effusion, no opacities or masses.   Pulmonary Functions Testing Results: No flowsheet data found.  Pathology:  07/27/2018 breast biopsy- fibroglandular tissue     Assessment & Plan:     ICD-10-CM   1. Abnormal magnetic resonance imaging of chest  R93.89 DG Chest 2 View  2. Need for immunization against influenza  Z23 Flu Vaccine QUAD 36+ mos IM    Abnormal MRI- pleural effusion. No  effusion on CXR.  No concerning pulmonary symptoms, medical history, or imaging. -Follow-up as needed.  She has been warned that an enlarging pleural effusion often presents with dyspnea on exertion that worsens over time, times cough.  If she develops new symptoms, she should return for reevaluation.  OSA; not currently on CPAP. -Follow-up with sleep medicine in about 1 month.  RTC PRN.   Current Outpatient Medications:  .  escitalopram (LEXAPRO) 5 MG tablet, TAKE 1 TABLET ONCE A DAY ORALLY 90 DAYS, Disp: , Rfl:  .  losartan (COZAAR) 100 MG tablet, Take 1 tablet by mouth daily., Disp: , Rfl:  .  MYRBETRIQ 25 MG TB24 tablet, Take 25 mg by mouth daily., Disp: , Rfl: 12   Maria DunnLaura P Natsumi Whitsitt, DO [facility] 04/06/2019 11:12 AM

## 2019-04-06 ENCOUNTER — Encounter: Payer: Self-pay | Admitting: Critical Care Medicine

## 2019-04-06 ENCOUNTER — Ambulatory Visit (INDEPENDENT_AMBULATORY_CARE_PROVIDER_SITE_OTHER): Payer: PRIVATE HEALTH INSURANCE

## 2019-04-06 ENCOUNTER — Ambulatory Visit (INDEPENDENT_AMBULATORY_CARE_PROVIDER_SITE_OTHER): Payer: PRIVATE HEALTH INSURANCE | Admitting: Critical Care Medicine

## 2019-04-06 ENCOUNTER — Other Ambulatory Visit: Payer: Self-pay

## 2019-04-06 VITALS — BP 140/82 | HR 81 | Temp 97.6°F | Ht 64.5 in | Wt 199.6 lb

## 2019-04-06 DIAGNOSIS — Z23 Encounter for immunization: Secondary | ICD-10-CM

## 2019-04-06 DIAGNOSIS — R9389 Abnormal findings on diagnostic imaging of other specified body structures: Secondary | ICD-10-CM | POA: Diagnosis not present

## 2019-04-06 IMAGING — DX DG CHEST 2V
2 series · 2 of 2 positions shown · non-contrast
Comparison: Chest radiograph [DATE]

CLINICAL DATA: Recent pleural effusion

EXAM:
CHEST - 2 VIEW

[chest pa]
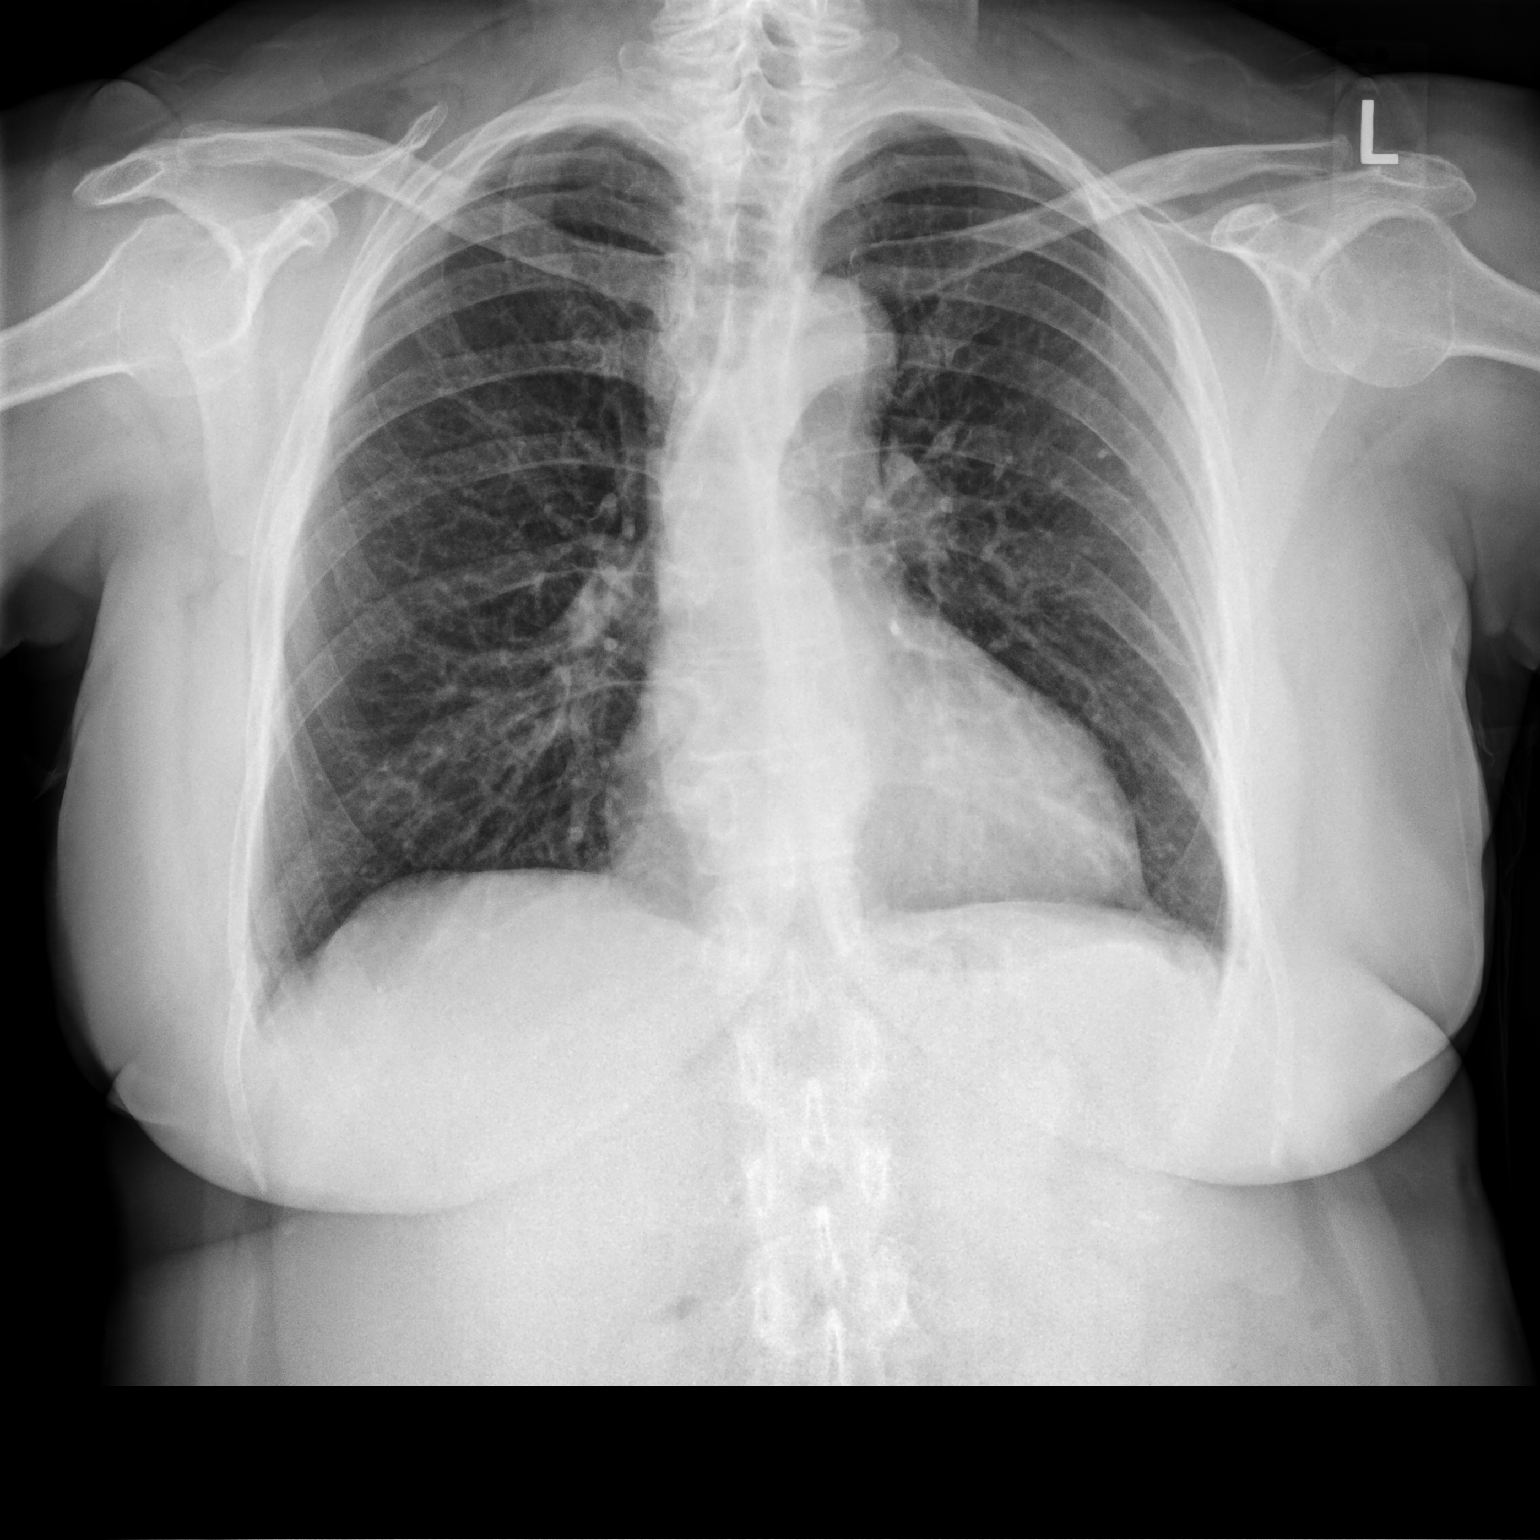

[chest lat]
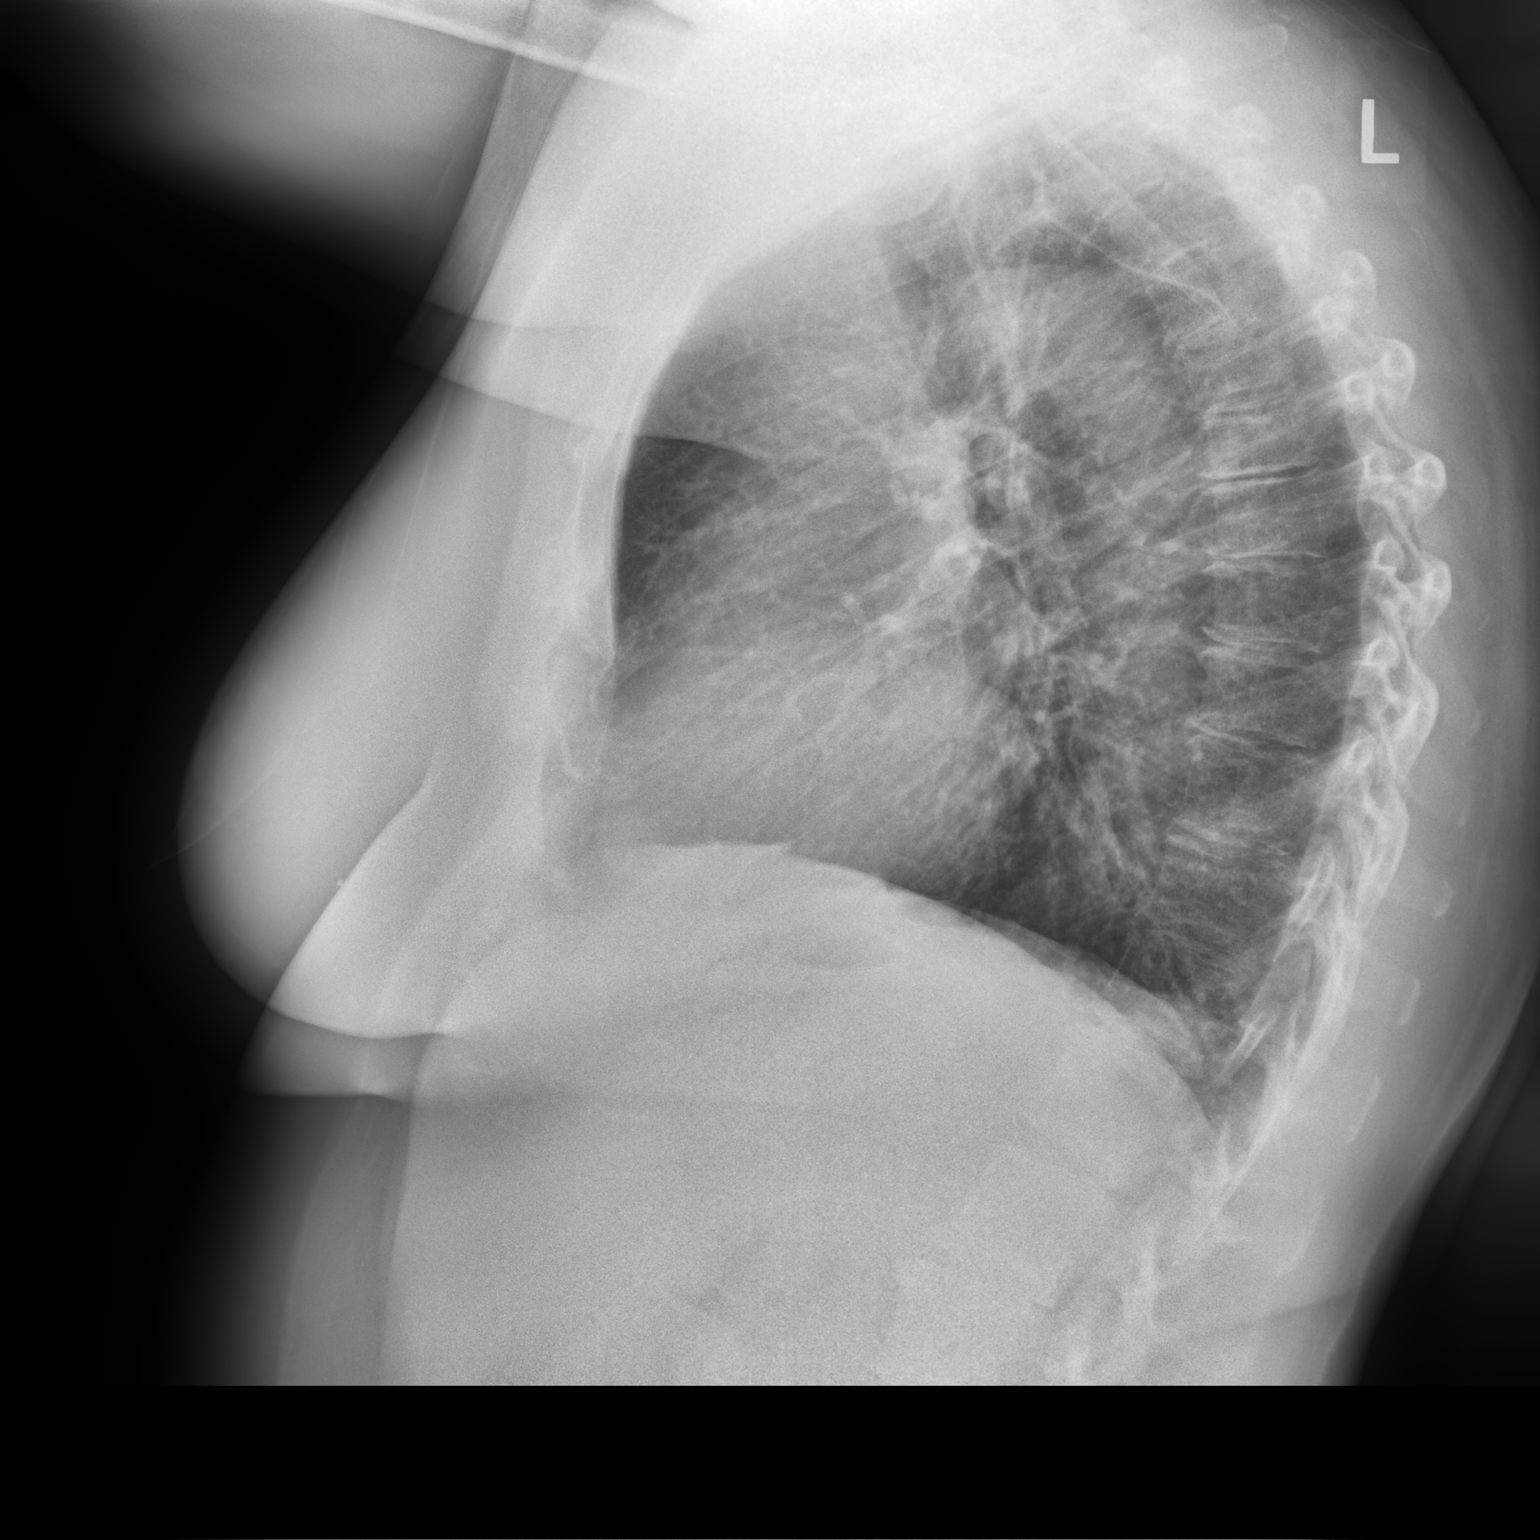

[2 of 2 positions shown; findings below may reference images not displayed]

FINDINGS: No edema or consolidation. There is a tiny granuloma in the left mid
lung. No appreciable pleural effusions. Heart upper normal in size
with pulmonary vascularity normal. No adenopathy. No bone lesions.
IMPRESSION: Tiny granuloma left mid lung. No edema or consolidation. No evident
pleural effusions. Heart upper normal in size. No evident
adenopathy.

## 2019-04-06 NOTE — Patient Instructions (Addendum)
Thank you for visiting Dr. Carlis Abbott at [facility]. We recommend the following: Orders Placed This Encounter  Procedures  . DG Chest 2 View   Orders Placed This Encounter  Procedures  . DG Chest 2 View    Standing Status:   Future    Number of Occurrences:   1    Standing Expiration Date:   06/04/2020    Order Specific Question:   Reason for Exam (SYMPTOM  OR DIAGNOSIS REQUIRED)    Answer:   follow up from MRI with pleural effusions    Order Specific Question:   Is patient pregnant?    Answer:   No    Order Specific Question:   Preferred imaging location?    Answer:   Internal    Order Specific Question:   Radiology Contrast Protocol - do NOT remove file path    Answer:   [email or web address]      Return in about 1 month (around 05/07/2019). You will follow up with one of our sleep medicine doctors, who can prescribe your sleep apnea supplies (insurance coverage will require that you see them).    Please do your part to reduce the spread of COVID-19.

## 2019-05-16 ENCOUNTER — Institutional Professional Consult (permissible substitution): Payer: PRIVATE HEALTH INSURANCE | Admitting: Pulmonary Disease

## 2019-05-17 ENCOUNTER — Encounter: Payer: Self-pay | Admitting: Pulmonary Disease

## 2019-05-17 ENCOUNTER — Other Ambulatory Visit: Payer: Self-pay

## 2019-05-17 ENCOUNTER — Ambulatory Visit (INDEPENDENT_AMBULATORY_CARE_PROVIDER_SITE_OTHER): Payer: PRIVATE HEALTH INSURANCE | Admitting: Pulmonary Disease

## 2019-05-17 VITALS — BP 118/76 | HR 87 | Temp 97.3°F | Ht 64.5 in | Wt 192.2 lb

## 2019-05-17 DIAGNOSIS — R0683 Snoring: Secondary | ICD-10-CM

## 2019-05-17 NOTE — Progress Notes (Signed)
[facility]  Chief Complaint  Patient presents with  . Consult    Obstructive Sleep Apnea    Constitutional:  BP 118/76 (BP Location: Left Arm, Patient Position: Sitting, Cuff Size: Normal)   Pulse 87   Temp (!) 97.3 F (36.3 C)   Ht 5' 4.5" (1.638 m)   Wt 192 lb 3.2 oz (87.2 kg)   SpO2 100% Comment: on room air  BMI 32.48 kg/m   Past Medical History:  Allergies, Overactive bladder, HTN  Brief Summary:  Maria Stewart is a 58 y.o. female with obstructive sleep apnea.  She was seen by Dr. Shelle Iron previously.  Had sleep study in 2013.  Mild OSA.  Had trouble with mask fit and air leak.  DME wasn't helpful.  She stopped using CPAP.  She tried oral appliance (her husband is a Education officer, community).  She had previous jaw surgery and as a result oral appliance wouldn't work.  She continues to snore.  Her husband has to sleep in separate room.  She wakes up hearing herself snore at times.    Epworth score is 3 out of 24.   Physical Exam:   Appearance - well kempt   ENMT - clear nasal mucosa, midline nasal  septum, no oral exudates, no LAN, trachea midline, MP 4  Respiratory - normal chest wall, normal respiratory effort, no accessory muscle use, no wheeze/rales  CV - s1s2 regular rate and rhythm, no murmurs, no peripheral edema, radial pulses symmetric  GI - soft, non tender, no masses  Lymph - no adenopathy noted in neck and axillary areas  MSK - normal gait  Ext - no cyanosis, clubbing, or joint inflammation noted  Skin - no rashes, lesions, or ulcers  Neuro - normal strength, oriented x 3  Psych - normal mood and affect   Assessment/Plan:   Snoring with excessive daytime sleepiness. - will need to arrange for a home sleep study  Obesity. - discussed how weight can impact sleep and risk for sleep disordered breathing - discussed options to assist with weight loss: combination of diet modification, cardiovascular and strength  training exercises  Cardiovascular risk. - had an extensive discussion regarding the adverse health consequences related to untreated sleep disordered breathing - specifically discussed the risks for hypertension, coronary artery disease, cardiac dysrhythmias, cerebrovascular disease, and diabetes - lifestyle modification discussed  Safe driving practices. - discussed how sleep disruption can increase risk of accidents, particularly when driving - safe driving practices were discussed  Therapies for obstructive sleep apnea. - if the sleep study shows significant sleep apnea, then various therapies for treatment were reviewed: CPAP, oral appliance, and surgical interventions     Patient Instructions  Will arrange for home sleep study Will call to arrange for follow up after sleep study reviewed     Coralyn Helling, MD [facility] Pager: [phone] 05/17/2019, 9:30 AM  Flow Sheet    Sleep tests:  PSG 05/13/12 >> AHI 11.6, SpO2 low 87%  Review of Systems:  Constitutional: Negative.   HENT: Negative.   Eyes: Negative.   Respiratory: Negative.   Cardiovascular: Negative.   Gastrointestinal: Negative.   Endocrine: Negative.   Genitourinary: Positive for urgency.  Musculoskeletal: Negative.   Skin: Negative.   Allergic/Immunologic: Negative.   Neurological: Negative.   Hematological: Negative.   Psychiatric/Behavioral: Negative.    Medications:   Allergies as of 05/17/2019   No Known Allergies     Medication List  Accurate as of May 17, 2019  9:30 AM. If you have any questions, ask your nurse or doctor.        escitalopram 5 MG tablet Commonly known as: LEXAPRO TAKE 1 TABLET ONCE A DAY ORALLY 90 DAYS   losartan 100 MG tablet Commonly known as: COZAAR Take 1 tablet by mouth daily.   Myrbetriq 25 MG Tb24 tablet Generic drug: mirabegron ER Take 25 mg by mouth daily.       Past Surgical History:  She  has a past surgical  history that includes Abdominal hysterectomy (12/2001); Cesarean section (2001); Nasal sinus surgery (04/2011); Tubal ligation (03/2000); Bilateral temporomandibular joint arthroplasty; Reduction mammaplasty (Bilateral); and Mandible surgery.  Family History:  Her family history includes Breast cancer in her mother; Kidney cancer in her father; Liver cancer in her father; Melanoma in her mother; Prostate cancer in her father; Thyroid disease in her mother.  Social History:  She  reports that she has never smoked. She has never used smokeless tobacco. She reports current alcohol use. She reports that she does not use drugs.

## 2019-05-17 NOTE — Progress Notes (Signed)
  Subjective:     Patient ID: Maria Stewart, female   DOB: Oct 21, 1960, 58 y.o.   MRN: [id number]  HPI   Review of Systems  Constitutional: Negative.   HENT: Negative.   Eyes: Negative.   Respiratory: Negative.   Cardiovascular: Negative.   Gastrointestinal: Negative.   Endocrine: Negative.   Genitourinary: Positive for urgency.  Musculoskeletal: Negative.   Skin: Negative.   Allergic/Immunologic: Negative.   Neurological: Negative.   Hematological: Negative.   Psychiatric/Behavioral: Negative.        Objective:   Physical Exam     Assessment:         Plan:

## 2019-05-17 NOTE — Patient Instructions (Signed)
Will arrange for home sleep study Will call to arrange for follow up after sleep study reviewed  

## 2019-07-04 ENCOUNTER — Other Ambulatory Visit: Payer: Self-pay

## 2019-07-04 ENCOUNTER — Ambulatory Visit: Payer: PRIVATE HEALTH INSURANCE

## 2019-07-04 DIAGNOSIS — R0683 Snoring: Secondary | ICD-10-CM

## 2019-07-06 ENCOUNTER — Other Ambulatory Visit: Payer: Self-pay

## 2019-07-06 ENCOUNTER — Ambulatory Visit: Payer: PRIVATE HEALTH INSURANCE

## 2019-07-12 ENCOUNTER — Other Ambulatory Visit: Payer: Self-pay

## 2019-07-12 ENCOUNTER — Ambulatory Visit: Payer: PRIVATE HEALTH INSURANCE

## 2019-07-12 DIAGNOSIS — G4733 Obstructive sleep apnea (adult) (pediatric): Secondary | ICD-10-CM | POA: Diagnosis not present

## 2019-07-17 ENCOUNTER — Telehealth: Payer: Self-pay | Admitting: Pulmonary Disease

## 2019-07-17 DIAGNOSIS — G4733 Obstructive sleep apnea (adult) (pediatric): Secondary | ICD-10-CM | POA: Diagnosis not present

## 2019-07-17 NOTE — Telephone Encounter (Signed)
HST 07/12/19 >> AHI 8.5, SpO2 low 88%.  Please inform her that her sleep study shows mild obstructive sleep apnea.  Please arrange for ROV with me or NP to discuss treatment options.

## 2019-07-18 NOTE — Telephone Encounter (Signed)
Patient has active mychart. Can be offered a video visit.

## 2019-07-18 NOTE — Telephone Encounter (Signed)
LVMTCB x 1 patient. 

## 2019-07-24 ENCOUNTER — Encounter: Payer: Self-pay | Admitting: Primary Care

## 2019-07-24 ENCOUNTER — Other Ambulatory Visit: Payer: Self-pay

## 2019-07-24 ENCOUNTER — Ambulatory Visit (INDEPENDENT_AMBULATORY_CARE_PROVIDER_SITE_OTHER): Payer: PRIVATE HEALTH INSURANCE | Admitting: Primary Care

## 2019-07-24 DIAGNOSIS — G4733 Obstructive sleep apnea (adult) (pediatric): Secondary | ICD-10-CM | POA: Diagnosis not present

## 2019-07-24 NOTE — Patient Instructions (Addendum)
Home sleep study showed mild obstructive sleep apnea; AHI 8.5, SpO2 low 88%  Recommendations: New CPAP trial Name to wear CPAP every night for 4-6 hours or more Do not drive if experiencing excessive daytime fatigue or somnolence Do not take sedating medication or drink alcohol in excess prior to bedtime without CPAP use as these can worsen sleep apnea  Orders: New CPAP start-auto titrate 5-15 cm h20, mask of choice, supplies and humidification  Follow-up: 6 to 8 weeks with Dr. Halford Chessman or NP    CPAP and BPAP Information CPAP and BPAP are methods of helping a person breathe with the use of air pressure. CPAP stands for "continuous positive airway pressure." BPAP stands for "bi-level positive airway pressure." In both methods, air is blown through your nose or mouth and into your air passages to help you breathe well. CPAP and BPAP use different amounts of pressure to blow air. With CPAP, the amount of pressure stays the same while you breathe in and out. With BPAP, the amount of pressure is increased when you breathe in (inhale) so that you can take larger breaths. Your health care provider will recommend whether CPAP or BPAP would be more helpful for you. Why are CPAP and BPAP treatments used? CPAP or BPAP can be helpful if you have:  Sleep apnea.  Chronic obstructive pulmonary disease (COPD).  Heart failure.  Medical conditions that weaken the muscles of the chest including muscular dystrophy, or neurological diseases such as amyotrophic lateral sclerosis (ALS).  Other problems that cause breathing to be weak, abnormal, or difficult. CPAP is most commonly used for obstructive sleep apnea (OSA) to keep the airways from collapsing when the muscles relax during sleep. How is CPAP or BPAP administered? Both CPAP and BPAP are provided by a small machine with a flexible plastic tube that attaches to a plastic mask. You wear the mask. Air is blown through the mask into your nose or mouth. The  amount of pressure that is used to blow the air can be adjusted on the machine. Your health care provider will determine the pressure setting that should be used based on your individual needs. When should CPAP or BPAP be used? In most cases, the mask only needs to be worn during sleep. Generally, the mask needs to be worn throughout the night and during any daytime naps. People with certain medical conditions may also need to wear the mask at other times when they are awake. Follow instructions from your health care provider about when to use the machine. What are some tips for using the mask?   Because the mask needs to be snug, some people feel trapped or closed-in (claustrophobic) when first using the mask. If you feel this way, you may need to get used to the mask. One way to do this is by holding the mask loosely over your nose or mouth and then gradually applying the mask more snugly. You can also gradually increase the amount of time that you use the mask.  Masks are available in various types and sizes. Some fit over your mouth and nose while others fit over just your nose. If your mask does not fit well, talk with your health care provider about getting a different one.  If you are using a mask that fits over your nose and you tend to breathe through your mouth, a chin strap may be applied to help keep your mouth closed.  The CPAP and BPAP machines have alarms that may sound  if the mask comes off or develops a leak.  If you have trouble with the mask, it is very important that you talk with your health care provider about finding a way to make the mask easier to tolerate. Do not stop using the mask. Stopping the use of the mask could have a negative impact on your health. What are some tips for using the machine?  Place your CPAP or BPAP machine on a secure table or stand near an electrical outlet.  Know where the on/off switch is located on the machine.  Follow instructions from your  health care provider about how to set the pressure on your machine and when you should use it.  Do not eat or drink while the CPAP or BPAP machine is on. Food or fluids could get pushed into your lungs by the pressure of the CPAP or BPAP.  Do not smoke. Tobacco smoke residue can damage the machine.  For home use, CPAP and BPAP machines can be rented or purchased through home health care companies. Many different brands of machines are available. Renting a machine before purchasing may help you find out which particular machine works well for you.  Keep the CPAP or BPAP machine and attachments clean. Ask your health care provider for specific instructions. Get help right away if:  You have redness or open areas around your nose or mouth where the mask fits.  You have trouble using the CPAP or BPAP machine.  You cannot tolerate wearing the CPAP or BPAP mask.  You have pain, discomfort, and bloating in your abdomen. Summary  CPAP and BPAP are methods of helping a person breathe with the use of air pressure.  Both CPAP and BPAP are provided by a small machine with a flexible plastic tube that attaches to a plastic mask.  If you have trouble with the mask, it is very important that you talk with your health care provider about finding a way to make the mask easier to tolerate. This information is not intended to replace advice given to you by your health care provider. Make sure you discuss any questions you have with your health care provider. Document Revised: 09/21/2018 Document Reviewed: 04/20/2016 Elsevier Patient Education  2020 [employer].

## 2019-07-24 NOTE — Progress Notes (Signed)
Reviewed and agree with assessment/plan.   Rogina Schiano, MD [employer] [facility] 06/10/2016, 12:24 PM Pager:  [phone]  

## 2019-07-24 NOTE — Progress Notes (Signed)
Virtual Visit via Telephone Note  I connected with Maria Stewart on 07/24/19 at  2:30 PM EST by telephone and verified that I am speaking with the correct person using two identifiers.  Location: Patient: Home Provider: Office   I discussed the limitations, risks, security and privacy concerns of performing an evaluation and management service by telephone and the availability of in person appointments. I also discussed with the patient that there may be a patient responsible charge related to this service. The patient expressed understanding and agreed to proceed.   History of Present Illness: 59 year old female, never smoked.  Past medical history significant for obstructive sleep apnea.  Patient of Dr. Craige Cotta, seen for sleep consult on 05/17/2019. PSG 05/13/12 >> AHI 11.6, SpO2 low 87%.  She did not tolerate CPAP due to mask fit and air leak.  She elected to try oral appliance (husband is a Education officer, community). She has a history of TMJ and previous jaw surgery, as a result oral appliance did not work out. HST 07/12/19 >> AHI 8.5, SpO2 low 88%. [facility] 3/24.   07/24/2019 Patient contacted today for telephone visit to go over home sleep test results.  Repeat home sleep test showed mild obstructive sleep apnea.  She continues to have issues with snoring. Discussed treatment options including weight loss, CPAP or ENT referral.  She unsuccessfully tried using an oral appliance.  She would like to resume CPAP therapy and try a new mask.  Discussed possible mask desensitization study, however, patient would like to hold off at this time.   Observations/Objective:  -Able to speak in full sentences -No observed shortness of breath wheezing or cough Assessment and Plan:  Mild obstructive sleep apnea: - HST  AHI 8.5, SpO2 low 88% - Discussed treatment options, patient elected to re-try CPAP therapy - Referral to DME for new CPAP start; auto titrate 5-15cm H20 with mask of choice - If continues to have issues with  mask fit or air leak suggest mask desensitization study - Advised patient not drive if experiencing excessive daytime fatigue or somnolence or take sedating medication/drink alcohol in excess prior to bedtim  - Continue to work on weight loss  Follow Up Instructions:   - FU in 6-8 weeks with download  I discussed the assessment and treatment plan with the patient. The patient was provided an opportunity to ask questions and all were answered. The patient agreed with the plan and demonstrated an understanding of the instructions.   The patient was advised to call back or seek an in-person evaluation if the symptoms worsen or if the condition fails to improve as anticipated.  I provided 18 minutes of non-face-to-face time during this encounter.   Glenford Bayley, NP

## 2019-07-25 NOTE — Telephone Encounter (Signed)
LVMTCB x 2 for patient. 

## 2019-07-31 NOTE — Telephone Encounter (Signed)
LMTCB x3 for pt.  

## 2019-08-02 NOTE — Telephone Encounter (Signed)
Patient had televisit with Buelah Manis, NP 07/24/19.  Based on note, patient to follow up in 6 - 8 weeks. DME order for CPAP sent to [facility]. Nothing further needed at this time.

## 2019-08-23 ENCOUNTER — Other Ambulatory Visit: Payer: Self-pay | Admitting: Obstetrics and Gynecology

## 2019-08-23 DIAGNOSIS — Z1231 Encounter for screening mammogram for malignant neoplasm of breast: Secondary | ICD-10-CM

## 2019-09-12 ENCOUNTER — Telehealth: Payer: Self-pay | Admitting: Pulmonary Disease

## 2019-09-12 NOTE — Telephone Encounter (Signed)
Spoke with patient about letter needing to go to insurance. Patient didn't have fax number or address. I called [employer] at [phone] and was told I needed to call [employer] at [phone].  I was given the fax number [phone] and informed that they needed Letter of Medical Necessity, Purchase price, duration of rental if there is one, and sleep study results.    Called [facility] and spoke with Shanda Bumps who told me that they have already started the process of getting all of this taken care of for the patient. She said that they would get it taken care of.     Talked to [employer] again and was told that her visit on 2/8 was not covered since it was a Televisit but they did state that it would be covered with appropriate modifiers added. Teams message and this message with be routed to Dr. Craige Cotta as well as Marisue Ivan to hopefully get this fixed.

## 2019-09-12 NOTE — Telephone Encounter (Signed)
Called the pt and she did not answer- line rings and then fast busy tone- Will need fax number or address for where she wants letter sent.

## 2019-09-20 ENCOUNTER — Other Ambulatory Visit: Payer: Self-pay

## 2019-09-20 ENCOUNTER — Ambulatory Visit
Admission: RE | Admit: 2019-09-20 | Discharge: 2019-09-20 | Disposition: A | Discharge status: Home / Self Care | Payer: PRIVATE HEALTH INSURANCE | Source: Ambulatory Visit | Attending: Obstetrics and Gynecology | Admitting: Obstetrics and Gynecology

## 2019-09-20 DIAGNOSIS — Z1231 Encounter for screening mammogram for malignant neoplasm of breast: Secondary | ICD-10-CM

## 2019-09-20 IMAGING — MG DIGITAL SCREENING BILAT W/ TOMO W/ CAD
8 series · 8 of 24 positions shown · non-contrast
Comparison: Previous exam(s).

CLINICAL DATA: Screening.

EXAM:
DIGITAL SCREENING BILATERAL MAMMOGRAM WITH TOMO AND CAD

[R MLO synth-2D]
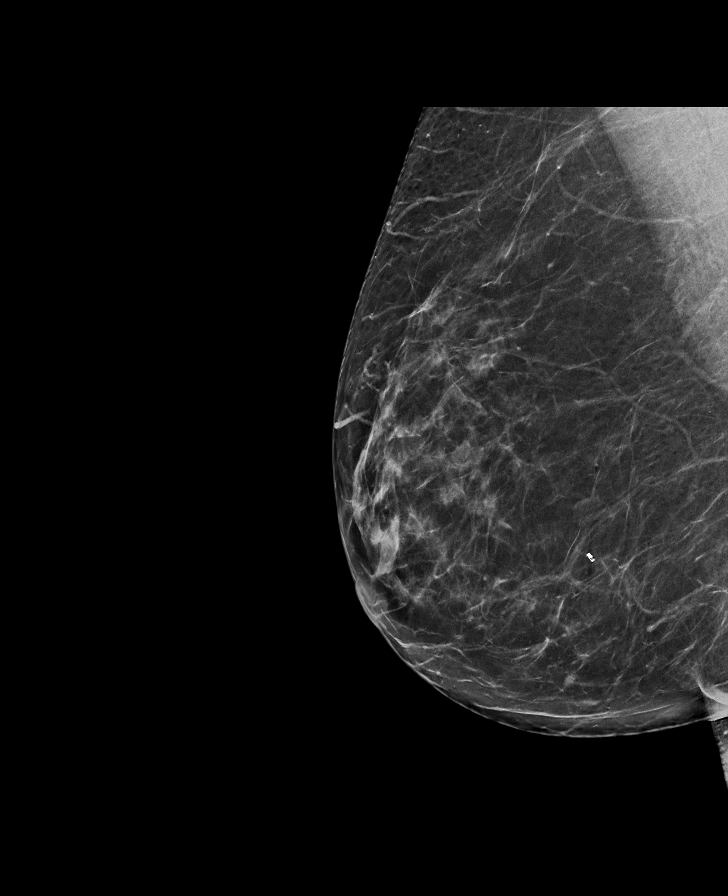

[L MLO synth-2D]
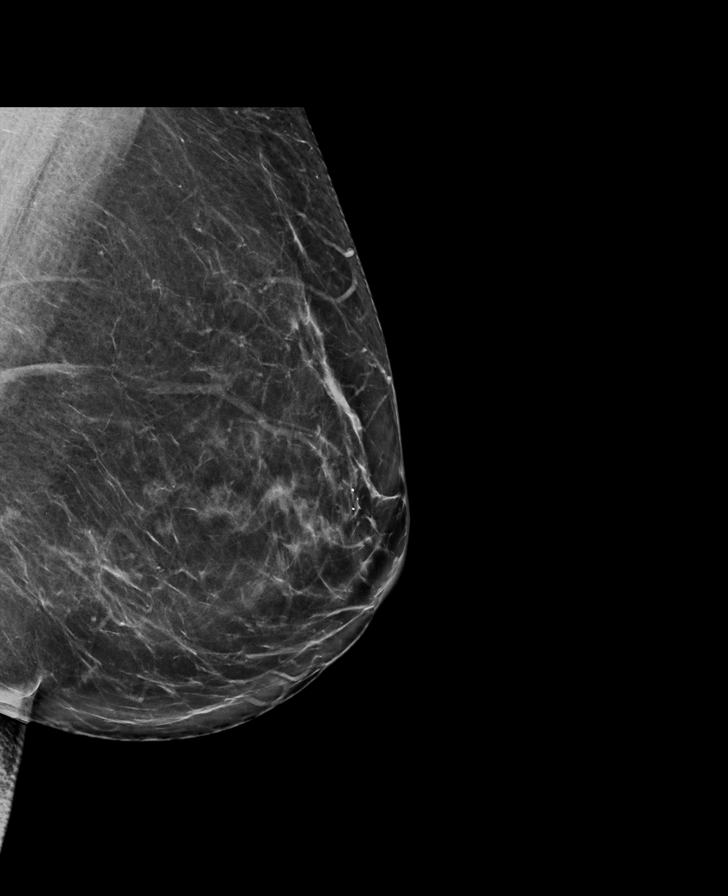

[L CC synth-2D]
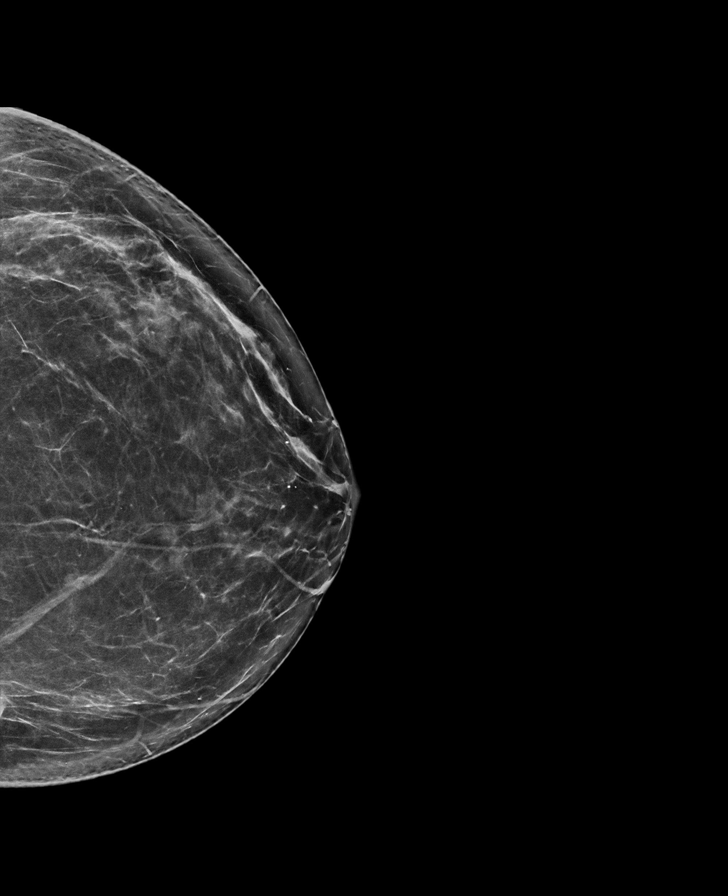

[R CC synth-2D]
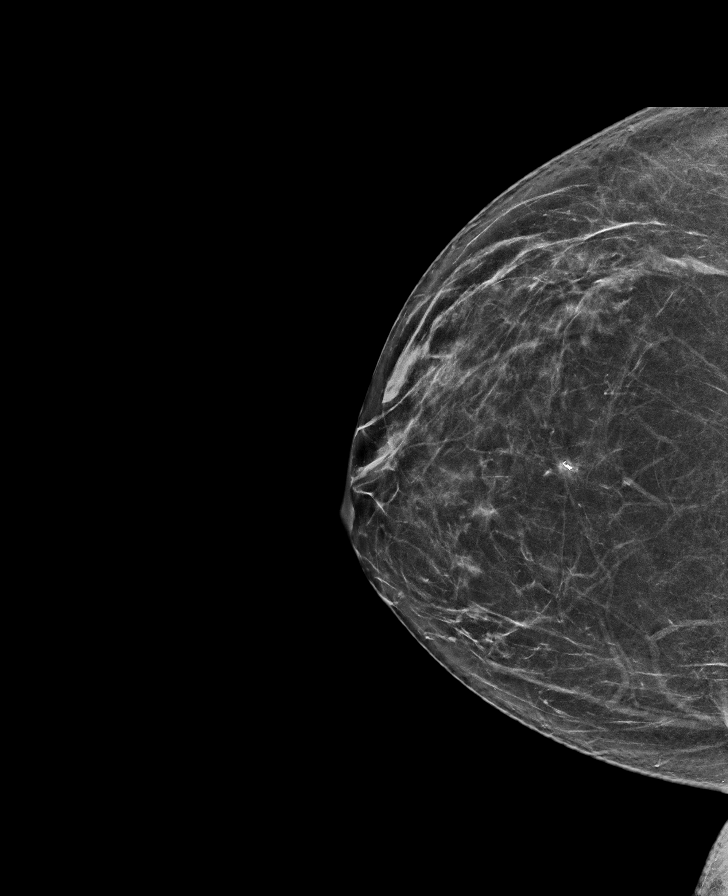

[R MLO tomo · tomo slice 41/80.0]
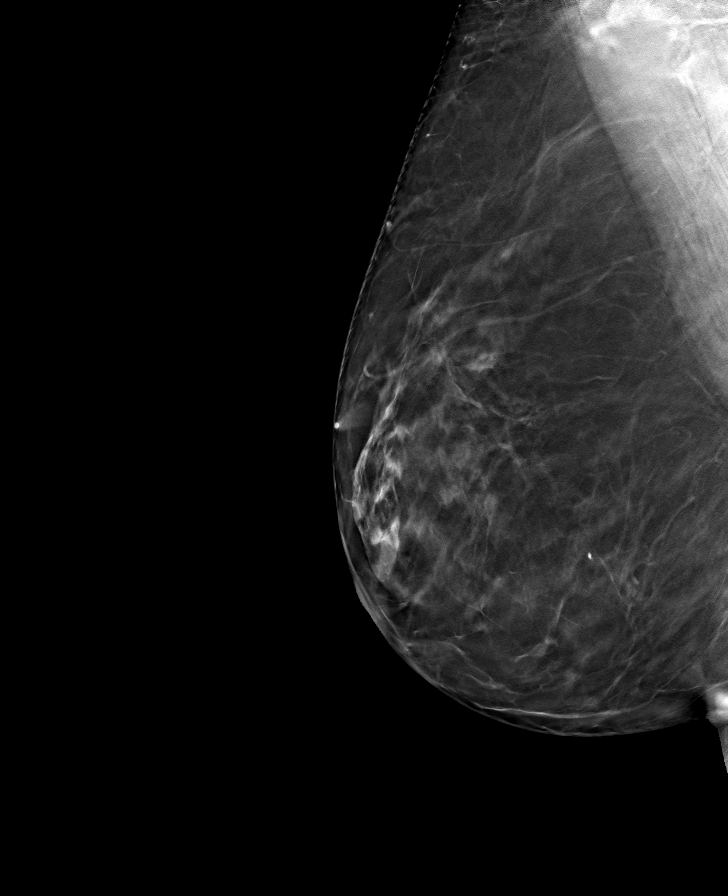

[L MLO tomo · tomo slice 39/78.0]
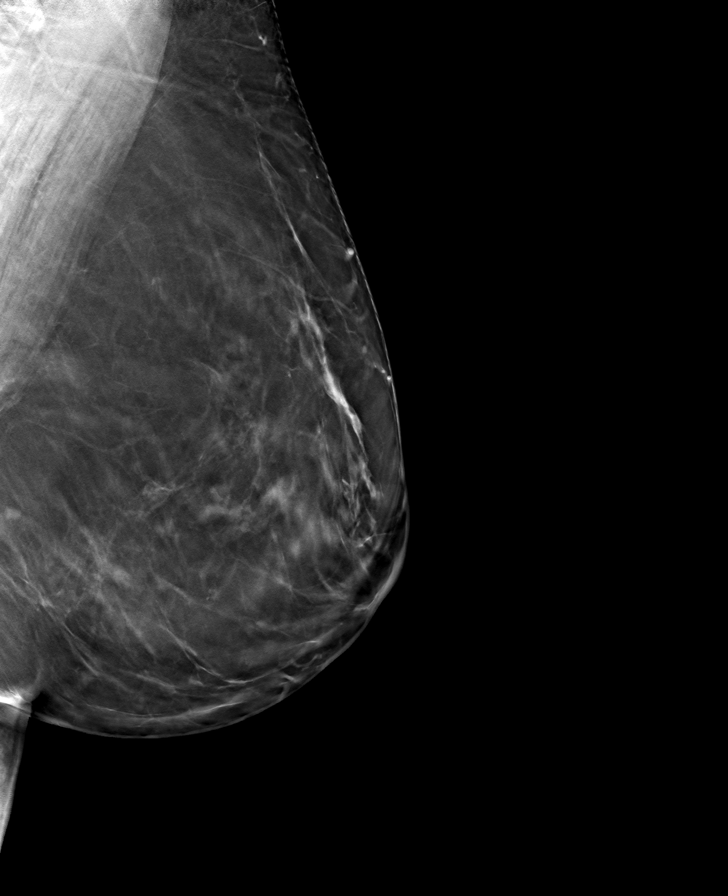

[L CC tomo · tomo slice 39/76.0]
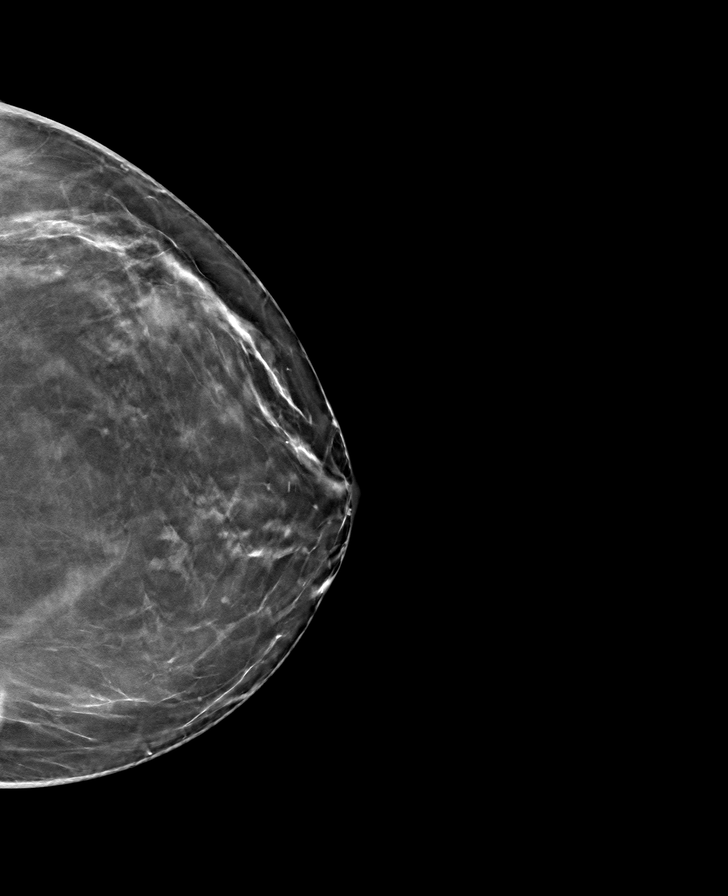

[R CC tomo · tomo slice 38/75.0]
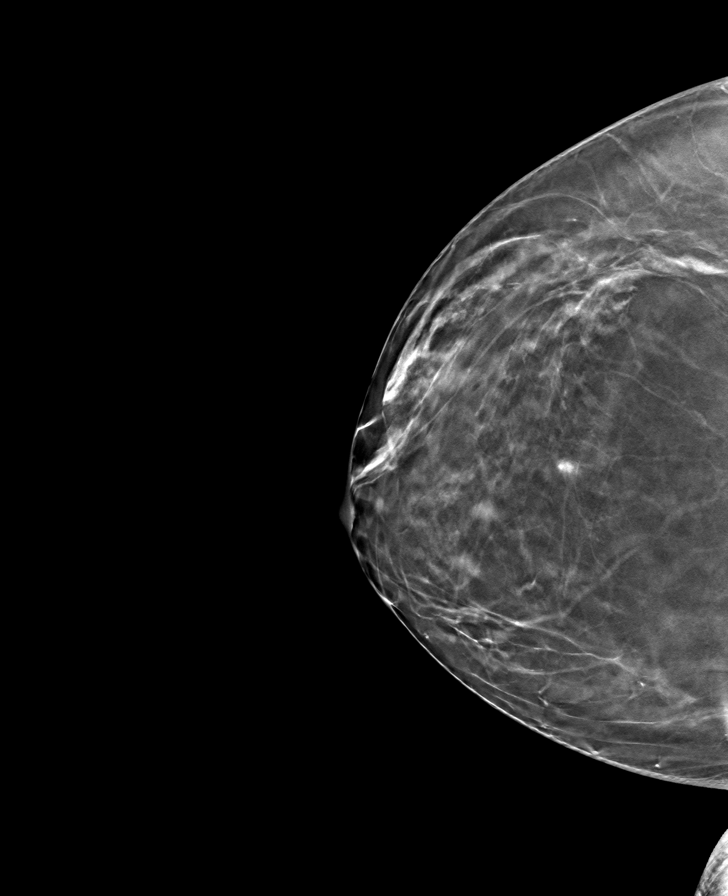

[8 of 24 positions shown; findings below may reference images not displayed]

ACR Breast Density Category b: There are scattered areas of
fibroglandular density.
FINDINGS: There are no findings suspicious for malignancy. Images were
processed with CAD.
IMPRESSION: No mammographic evidence of malignancy. A result letter of this
screening mammogram will be mailed directly to the patient.

RECOMMENDATION:
Screening mammogram in one year. (Code:[TQ])

BI-RADS CATEGORY  1: Negative.

## 2019-09-22 NOTE — Telephone Encounter (Signed)
Marisue Ivan, please advise if you have an update for [place] on this if all was taken care of for pt.

## 2019-09-26 NOTE — Telephone Encounter (Signed)
Patient's 2/8 visit has been paid by insurance.

## 2019-09-26 NOTE — Telephone Encounter (Signed)
This doesn't related to a documentation requirement on my part.  There is an extra coding requirement.  I am not sure what coding modifier is needed.  That is something that needs to be addressed by Marisue Ivan.  Please follow up with her to determine what code modifier they are requiring, and then this needs to be resubmitted.

## 2019-09-26 NOTE — Telephone Encounter (Signed)
Dr. Craige Cotta have you addended the note from 2/8 for patient?

## 2019-09-26 NOTE — Telephone Encounter (Signed)
I checked VS' box and did not see anything for this patient.

## 2019-11-01 ENCOUNTER — Encounter: Payer: Self-pay | Admitting: Pulmonary Disease

## 2019-11-01 ENCOUNTER — Ambulatory Visit (INDEPENDENT_AMBULATORY_CARE_PROVIDER_SITE_OTHER): Payer: PRIVATE HEALTH INSURANCE | Admitting: Pulmonary Disease

## 2019-11-01 ENCOUNTER — Other Ambulatory Visit: Payer: Self-pay

## 2019-11-01 VITALS — BP 128/70 | HR 78 | Temp 98.4°F | Ht 64.0 in | Wt 198.8 lb

## 2019-11-01 DIAGNOSIS — G2581 Restless legs syndrome: Secondary | ICD-10-CM | POA: Diagnosis not present

## 2019-11-01 DIAGNOSIS — G4733 Obstructive sleep apnea (adult) (pediatric): Secondary | ICD-10-CM

## 2019-11-01 NOTE — Patient Instructions (Signed)
Follow up in 1 year.

## 2019-11-01 NOTE — Progress Notes (Signed)
[facility]  Chief Complaint  Patient presents with  . Follow-up    Constitutional:  BP 128/70 (BP Location: Right Arm, Cuff Size: Normal)   Pulse 78   Temp 98.4 F (36.9 C) (Temporal)   Ht 5\' 4"  (1.626 m)   Wt 198 lb 12.8 oz (90.2 kg)   SpO2 96% Comment: RA  BMI 34.12 kg/m   Past Medical History:  Allergies, Overactive bladder, HTN  Brief Summary:  ROMA BONDAR is a 59 y.o. female with obstructive sleep apnea.  Subjective:   She had home sleep study in January.  Mild OSA.  Started on auto CPAP.  Using nasal cushion mask.  No issues with mask fit.  Denies sinus congestion, sore throat, dry mouth, aerophagia.  Occasionally has trouble falling asleep.  Usually because her legs ache.  She takes a warm bath at night and gets a massage.  These help.  Her schedule has been busy over past several weeks.  Her daughter got married.  Next year her other daughter will be getting married.  Physical Exam:   Appearance - well kempt   ENMT - no sinus tenderness, no oral exudate, no LAN, Mallampati 4 airway, no stridor  Respiratory - equal breath sounds bilaterally, no wheezing or rales  CV - s1s2 regular rate and rhythm, no murmurs  Ext - no clubbing, no edema  Skin - no rashes  Psych - normal mood and affect   Assessment/Plan:   Obstructive sleep apnea. - she is compliant with CPAP and reports benefit - continue auto CPAP  Achy legs. - might be related to RLS versus fibromyalgia - mild; she would like to defer medication interventions at this time - continue conservative measures for now  A total of  21 minutes spent addressing patient care issues on day of visit.   Follow up:   Patient Instructions  Follow up in 1 year   Signature:  February, MD [facility] Pulmonary/Critical Care Pager: [phone] 11/01/2019, 10:24 AM  Flow Sheet    Sleep tests:  PSG 05/13/12 >> AHI 11.6, SpO2 low 87% HST 07/12/19 >> AHI  8.5, SpO2 low 88%. Auto CPAP 09/29/19 to 10/28/19 >> used on 29 of 30 nights with average 7 hrs 14 min.  Average AHI 1.8 with median CPAP 9 and 95 th percentile CPAP 12 cm H2O.  Medications:   Allergies as of 11/01/2019   No Known Allergies     Medication List       Accurate as of Nov 01, 2019 10:24 AM. If you have any questions, ask your nurse or doctor.        escitalopram 5 MG tablet Commonly known as: LEXAPRO TAKE 1 TABLET ONCE A DAY ORALLY 90 DAYS   losartan 100 MG tablet Commonly known as: COZAAR Take 1 tablet by mouth daily.   Myrbetriq 25 MG Tb24 tablet Generic drug: mirabegron ER Take 25 mg by mouth daily.       Past Surgical History:  She  has a past surgical history that includes Abdominal hysterectomy (12/2001); Cesarean section (2001); Nasal sinus surgery (04/2011); Tubal ligation (03/2000); Bilateral temporomandibular joint arthroplasty; Reduction mammaplasty (Bilateral); and Mandible surgery.  Family History:  Her family history includes Breast cancer in her mother; Kidney cancer in her father; Liver cancer in her father; Melanoma in her mother; Prostate cancer in her father; Thyroid disease in her mother.  Social History:  She  reports that she has never smoked. She has  never used smokeless tobacco. She reports current alcohol use. She reports that she does not use drugs.

## 2019-11-28 ENCOUNTER — Telehealth: Payer: Self-pay | Admitting: Pulmonary Disease

## 2019-11-28 NOTE — Telephone Encounter (Signed)
Spoke with patient, she needs a letter of medical necessity, a copy of her sleep study, the price of CPAP rental and duration of need.  She gave the fax # [phone] to fax the documents.  She provided the dates of service:  07/12/2019-10/06/2019.  I advised her that I would let the provider know what is needed and would fax the information when received.  She verbalized understanding.

## 2019-11-29 NOTE — Telephone Encounter (Signed)
Left message for patient to call back  

## 2019-11-29 NOTE — Telephone Encounter (Signed)
Can you find out which type of CPAP machine she purchased and how much she paid for the CPAP machine.

## 2019-11-29 NOTE — Telephone Encounter (Signed)
Called and spoke with pt to see if she knew what type of CPAP machine it is that she purchased and also roughly how much she paid for it and she stated that she was not at home and did not know this info. Pt stated that she has been set up with DME [facility] which is now [employer].  Called [facility] and spoke with Caryl Pina to see what type of machine it was that pt purchased. Per Caryl Pina, pt purchased [employer] autopap machine. She stated that pt is still in the rental period as she just got her machine February 2021. The estimated first month cost is $722.60. Pt's cost is $76 a month until the deductible is met and then if the deductible is met, pt's cost will then go to $22.80

## 2019-11-29 NOTE — Telephone Encounter (Signed)
Pt returning a phone call. Pt can be reached at [phone].

## 2019-12-05 NOTE — Telephone Encounter (Signed)
Spoke with the pt and notified waiting on letter and will inform her once this is done

## 2019-12-05 NOTE — Telephone Encounter (Signed)
Pt calling back to check on the status of the letter of cpap machine. Pt can be reached at [phone].

## 2019-12-12 NOTE — Telephone Encounter (Signed)
Dr. Craige Cotta is not here today. There is not a letter in the chart. Will follow up when Dr. Craige Cotta returns to the office.

## 2019-12-18 ENCOUNTER — Encounter: Payer: Self-pay | Admitting: Pulmonary Disease

## 2019-12-18 NOTE — Telephone Encounter (Signed)
The letter has been printed and signed.  It is on my desk.

## 2019-12-19 NOTE — Telephone Encounter (Signed)
Faxed letter from Dr. Craige Cotta along with Sleep study to patient's insurance company at [phone].  Spoke with patient and make her aware the fax had been sent.  She requested that the fax also be sent to her husband's dental office at [phone].  Fax sent per patient request.  Nothing further needed.

## 2020-03-14 ENCOUNTER — Other Ambulatory Visit: Payer: Self-pay | Admitting: Obstetrics and Gynecology

## 2020-03-14 DIAGNOSIS — Z803 Family history of malignant neoplasm of breast: Secondary | ICD-10-CM

## 2020-04-16 ENCOUNTER — Other Ambulatory Visit: Payer: Self-pay | Admitting: Obstetrics and Gynecology

## 2020-04-17 ENCOUNTER — Other Ambulatory Visit: Payer: PRIVATE HEALTH INSURANCE

## 2020-05-03 ENCOUNTER — Ambulatory Visit
Admission: RE | Admit: 2020-05-03 | Discharge: 2020-05-03 | Disposition: A | Discharge status: Home / Self Care | Payer: PRIVATE HEALTH INSURANCE | Source: Ambulatory Visit | Attending: Obstetrics and Gynecology | Admitting: Obstetrics and Gynecology

## 2020-05-03 ENCOUNTER — Other Ambulatory Visit: Payer: Self-pay

## 2020-05-03 DIAGNOSIS — Z803 Family history of malignant neoplasm of breast: Secondary | ICD-10-CM

## 2020-05-03 IMAGING — MR MR BREAST BILAT WO/W CM
8 of 12 series · 33 of 48 positions shown · IV contrast (9 ml gadavist)
Comparison: Breast MRI [DATE]

CLINICAL DATA: Patient with elevated lifetime risk of breast
cancer.

EXAM:
BILATERAL BREAST MRI WITH AND WITHOUT CONTRAST
TECHNIQUE: Multiplanar, multisequence MR images of both breasts were obtained
prior to and following the intravenous administration of 9 ml of
Gadavist

[Series 2: t2_tirm_tra ipat (a-p) · axial · 3.0mm · 0.70mm/px · 1 of 57 slices shown]
[im 1/57]
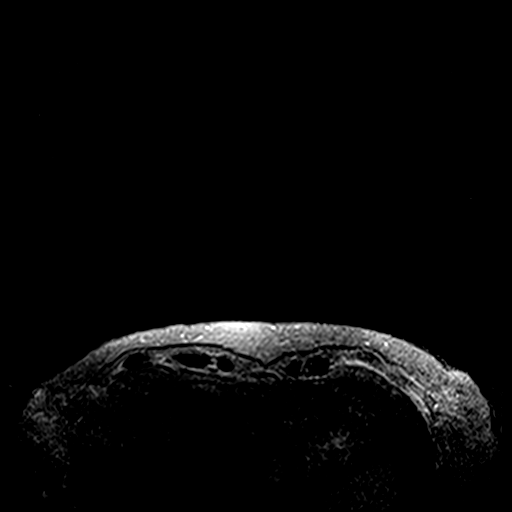

[Series 3: fl3d pre-cm no · axial · non-contrast · 1.2mm · 0.94mm/px · z∈[-72,+119]mm · 5 of 160 slices shown]
[im 1/160]
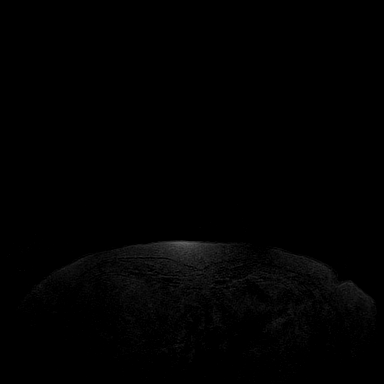
[im 40/160]
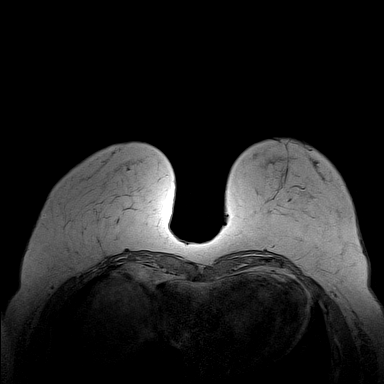
[im 80/160]
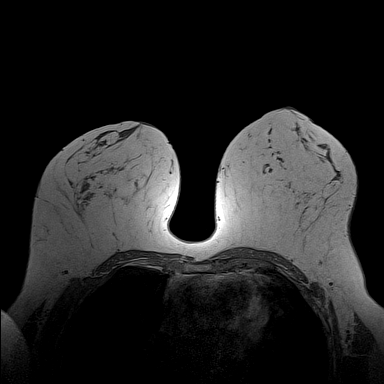
[im 120/160]
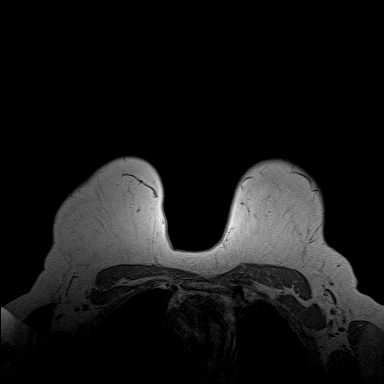
[im 160/160]
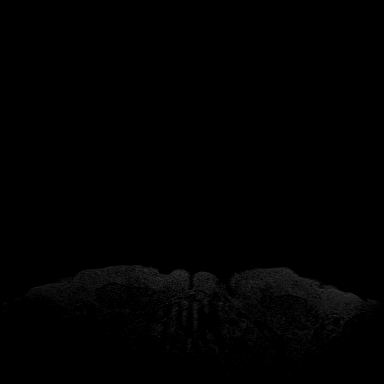

[Series 4: fl3d pre-cm · axial · non-contrast · 1.2mm · 0.94mm/px · z∈[-72,+119]mm · 5 of 160 slices shown]
[im 1/160]
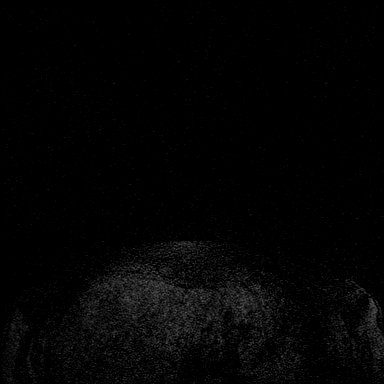
[im 40/160]
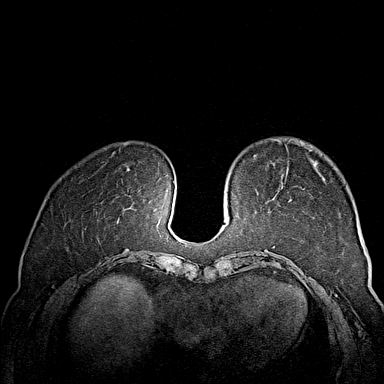
[im 80/160]
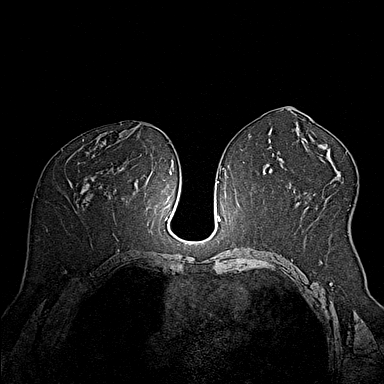
[im 120/160]
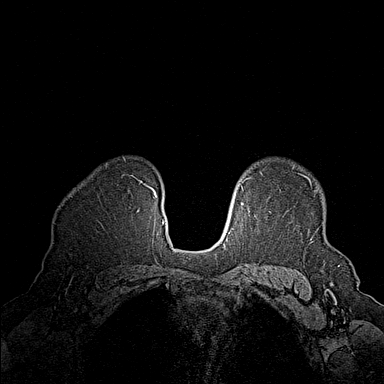
[im 160/160]
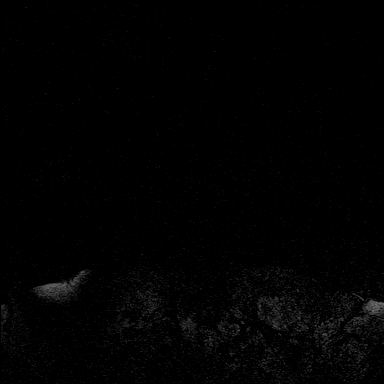

[Series 5: fl3d post-cm 20 · axial · 1.2mm · 0.94mm/px · z∈[-72,+119]mm · 5 of 160 slices shown (1 of 3)]
[im 1/160]
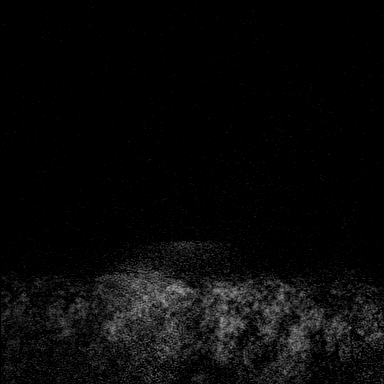
[im 40/160]
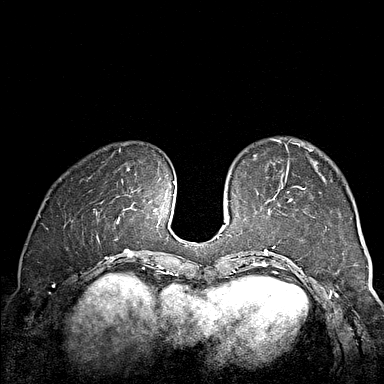
[im 80/160]
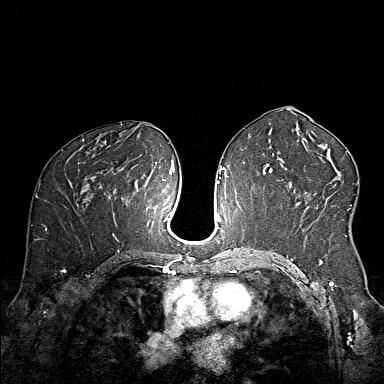
[im 120/160]
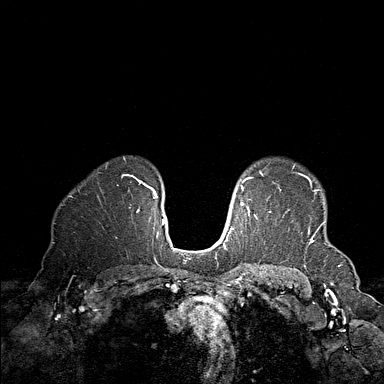
[im 160/160]
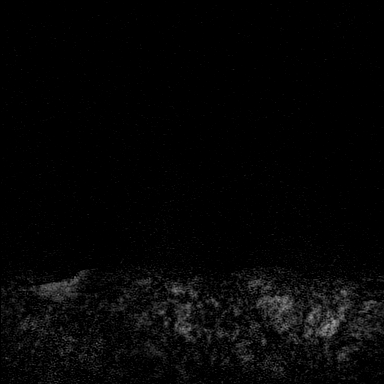

[Series 6: fl3d post-cm 20 · axial · 1.2mm · 0.94mm/px · z∈[-72,+119]mm · 5 of 160 slices shown (2 of 3)]
[im 1/160]
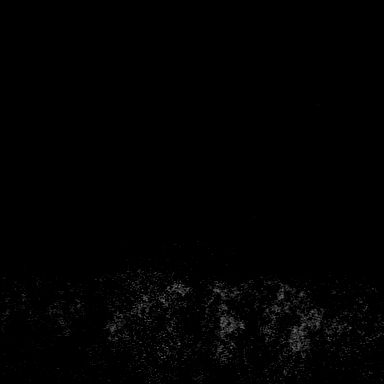
[im 40/160]
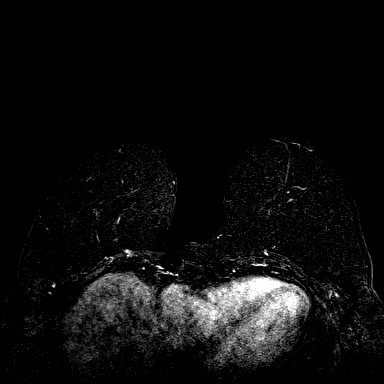
[im 80/160]
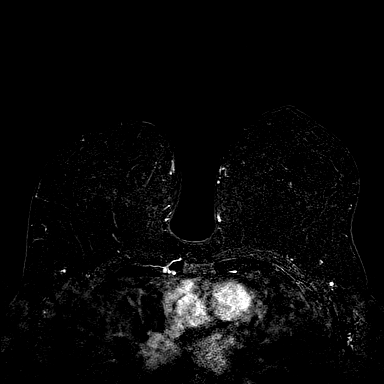
[im 120/160]
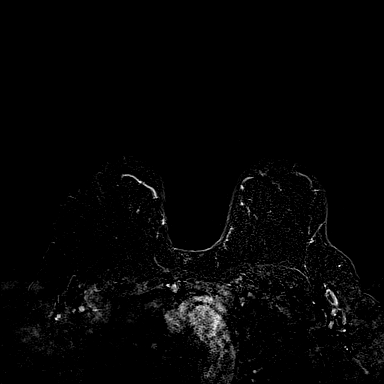
[im 160/160]
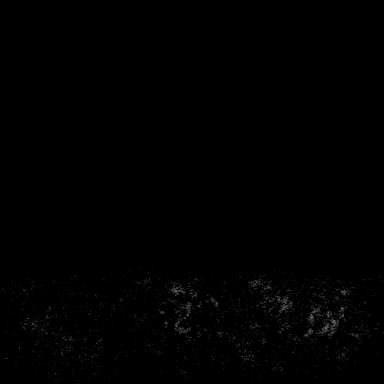

[Series 7: fl3d post-cm 20 · axial · 192.0mm · 0.94mm/px · 1 of 1 slices shown (3 of 3)]
[im 1/1]
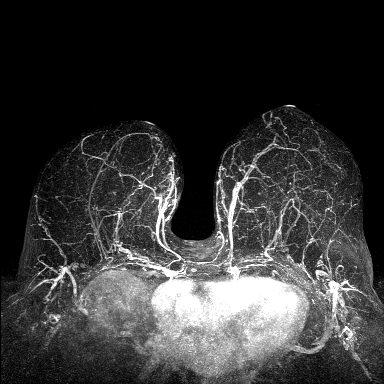

[Series 8: fl3d post-cm 3min · axial · 1.2mm · 0.94mm/px · z∈[-72,+119]mm · 6 of 160 slices shown]
[im 1/160]
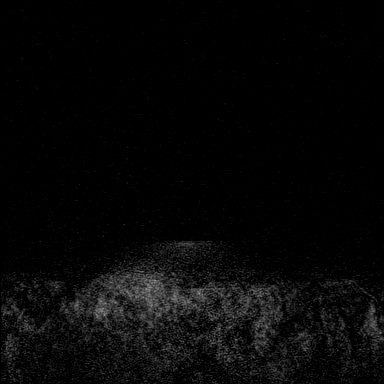
[im 32/160]
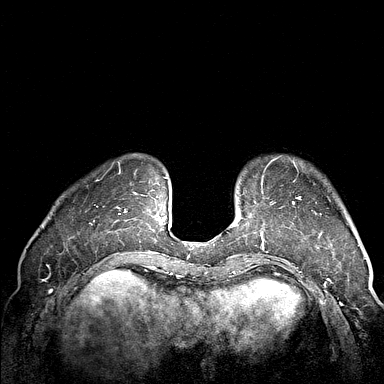
[im 64/160]
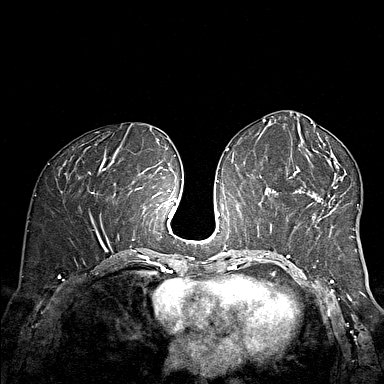
[im 96/160]
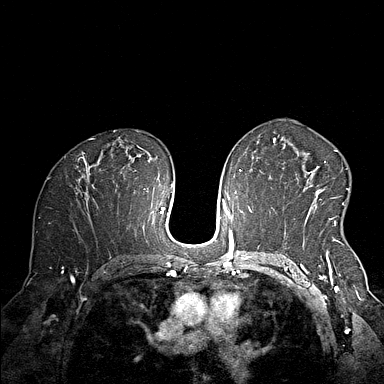
[im 128/160]
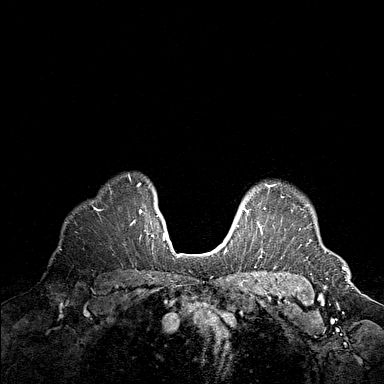
[im 160/160]
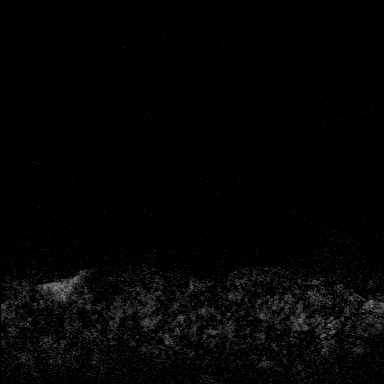

[Series 9: fl3d post-cm 3min_sub · axial · 1.2mm · 0.94mm/px · z∈[-72,+81]mm · 5 of 160 slices shown]
[im 1/160]
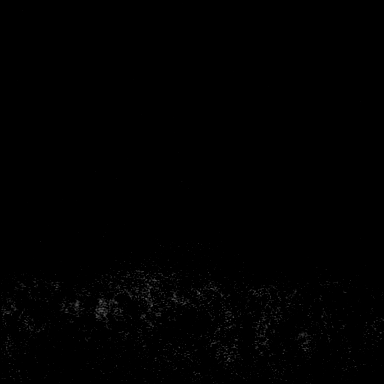
[im 32/160]
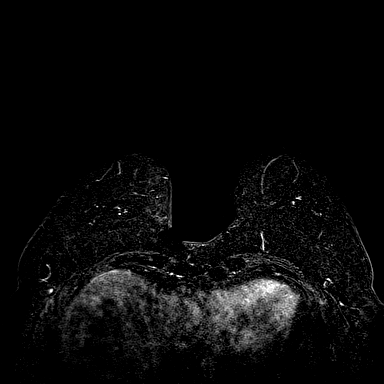
[im 64/160]
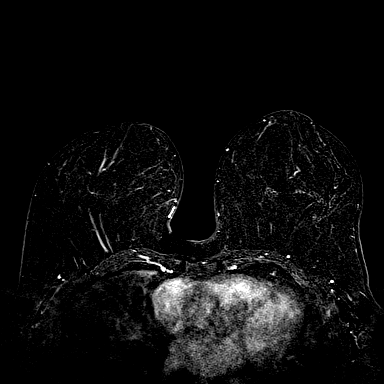
[im 96/160]
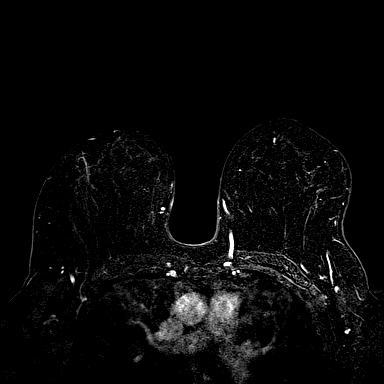
[im 128/160]
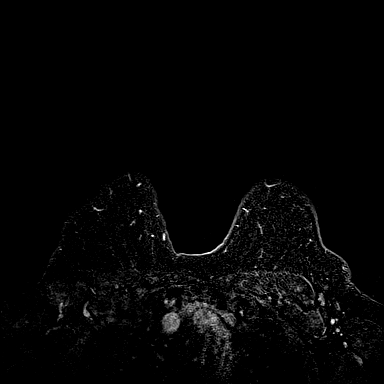

[33 of 48 positions shown; findings below may reference images not displayed]

Three-dimensional MR images were rendered by post-processing of the
original MR data on an independent workstation. The
three-dimensional MR images were interpreted, and findings are
reported in the following complete MRI report for this study. Three
dimensional images were evaluated at the independent interpreting
workstation using the DynaCAD thin client.
FINDINGS: Breast composition: b. Scattered fibroglandular tissue.

Background parenchymal enhancement: Minimal

Right breast: No mass or abnormal enhancement. Susceptibility
artifact right breast compatible with biopsy marking clip.

Left breast: No mass or abnormal enhancement.

Lymph nodes: No abnormal appearing lymph nodes.

Ancillary findings:  Trace bilateral pleural fluid.
IMPRESSION: No MRI evidence of malignancy within the breast bilaterally.

Trace bilateral pleural effusions.

RECOMMENDATION:
Annual high risk screening MRI.

Annual mammography.

BI-RADS CATEGORY  1: Negative.

## 2020-05-03 MED ORDER — GADOBUTROL 1 MMOL/ML IV SOLN
9.0000 mL | Freq: Once | INTRAVENOUS | Status: AC | PRN
  Administered 2020-05-03: 9 mL via INTRAVENOUS

## 2020-08-21 ENCOUNTER — Other Ambulatory Visit: Payer: Self-pay | Admitting: Obstetrics and Gynecology

## 2020-08-21 DIAGNOSIS — Z1231 Encounter for screening mammogram for malignant neoplasm of breast: Secondary | ICD-10-CM

## 2020-11-04 ENCOUNTER — Ambulatory Visit: Payer: PRIVATE HEALTH INSURANCE

## 2020-12-18 ENCOUNTER — Ambulatory Visit
Admission: RE | Admit: 2020-12-18 | Discharge: 2020-12-18 | Disposition: A | Discharge status: Home / Self Care | Payer: PRIVATE HEALTH INSURANCE | Source: Ambulatory Visit | Attending: Obstetrics and Gynecology | Admitting: Obstetrics and Gynecology

## 2020-12-18 ENCOUNTER — Other Ambulatory Visit: Payer: Self-pay

## 2020-12-18 DIAGNOSIS — Z1231 Encounter for screening mammogram for malignant neoplasm of breast: Secondary | ICD-10-CM

## 2020-12-18 IMAGING — MG MM DIGITAL SCREENING BILAT W/ TOMO AND CAD
8 series · 8 of 24 positions shown · non-contrast
Comparison: Previous exam(s).

CLINICAL DATA: Screening.

EXAM:
DIGITAL SCREENING BILATERAL MAMMOGRAM WITH TOMOSYNTHESIS AND CAD
TECHNIQUE: Bilateral screening digital craniocaudal and mediolateral oblique
mammograms were obtained. Bilateral screening digital breast
tomosynthesis was performed. The images were evaluated with
computer-aided detection.

[L MLO synth-2D]
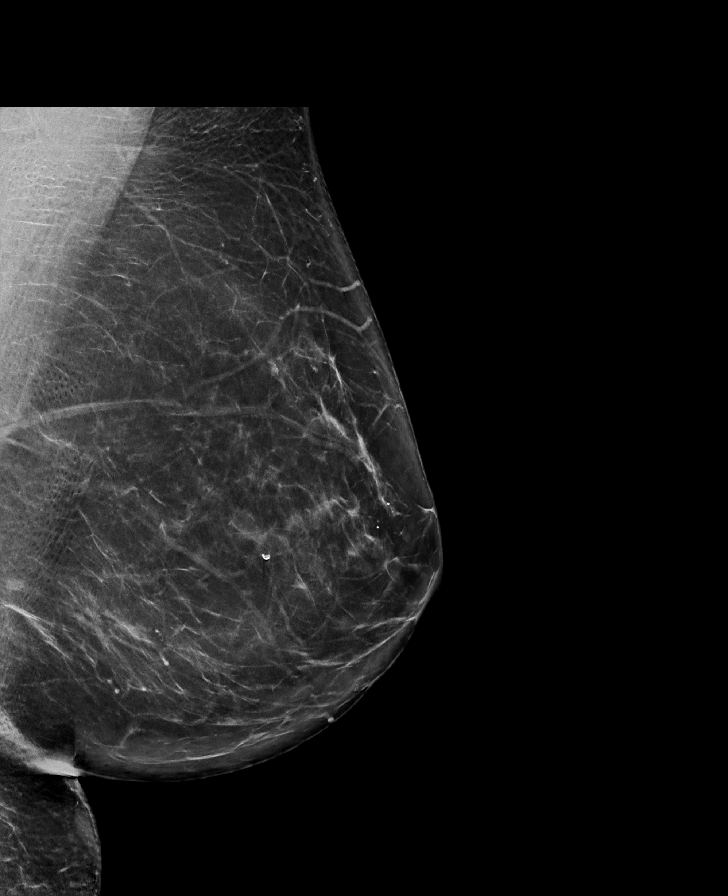

[L CC synth-2D]
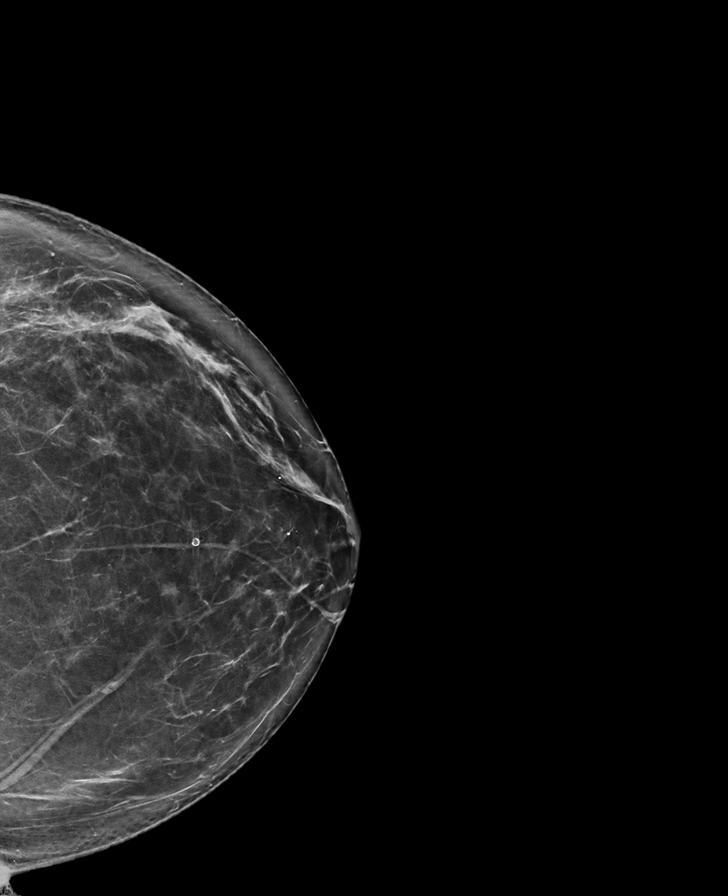

[R MLO synth-2D]
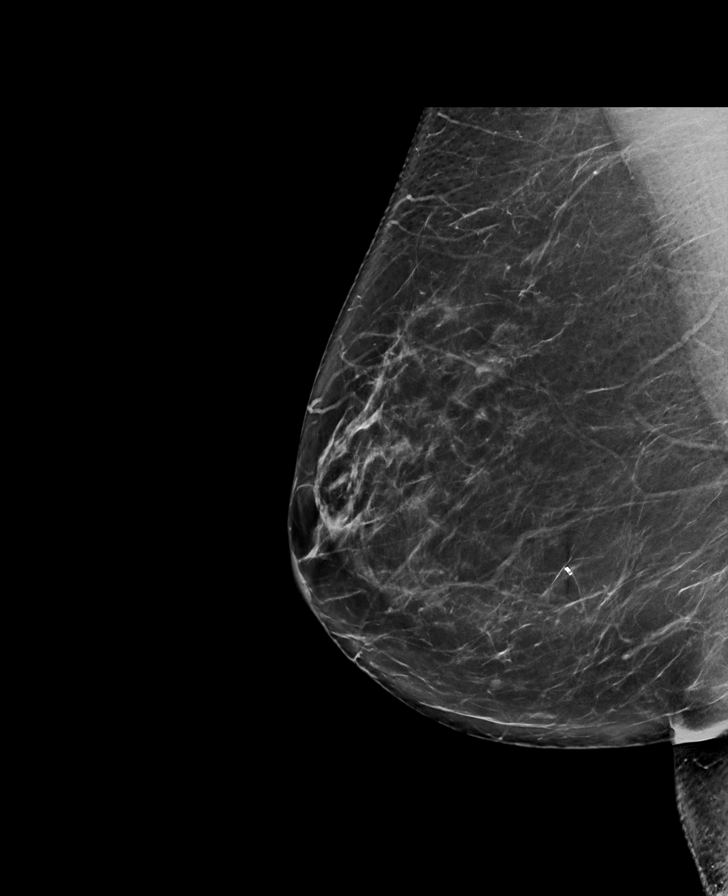

[R CC synth-2D]
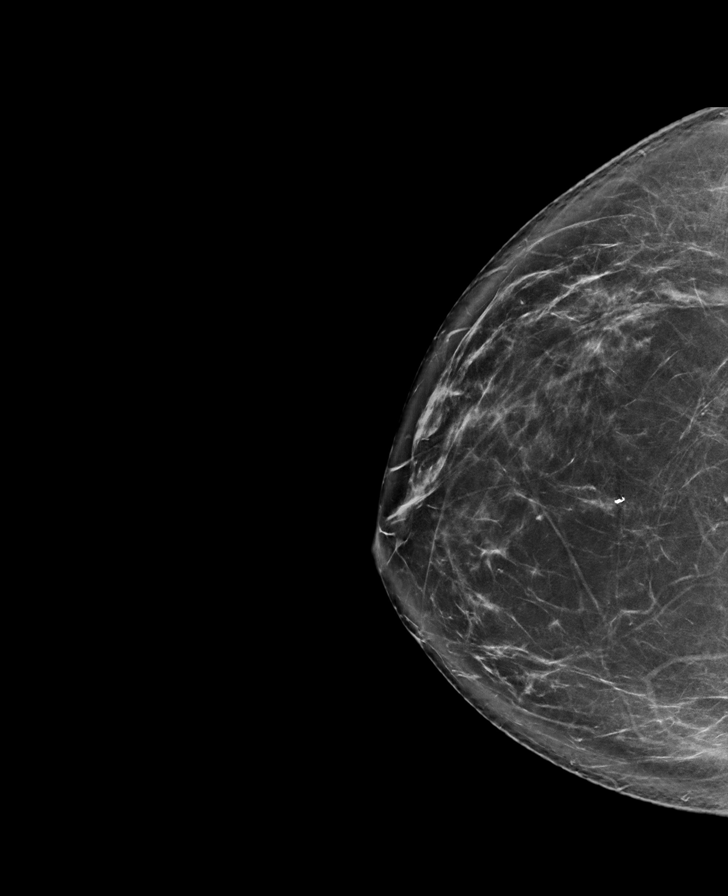

[R MLO tomo · tomo slice 45/90.0]
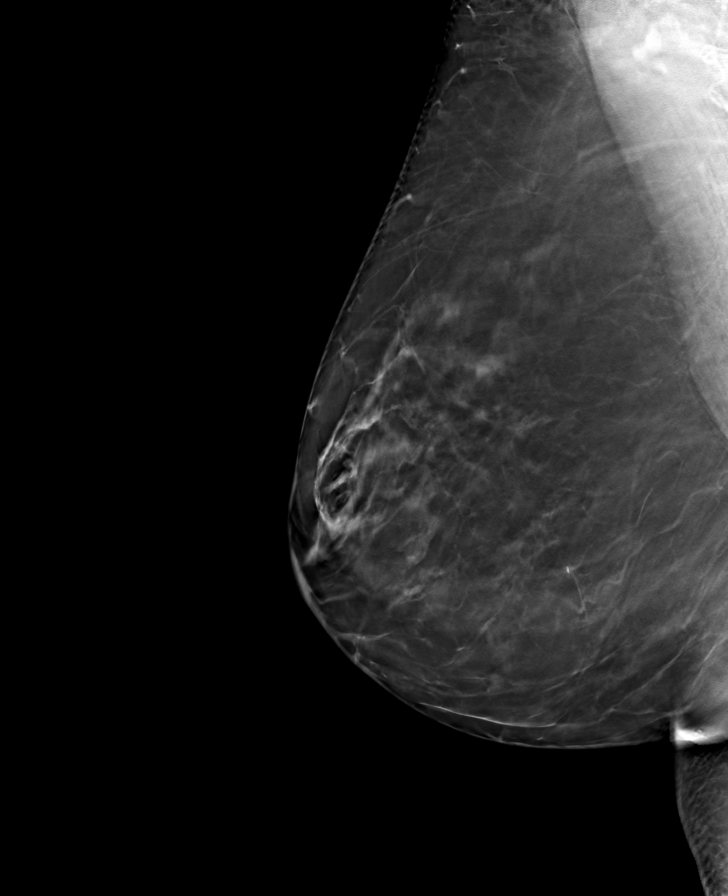

[L CC tomo · tomo slice 43/84.0]
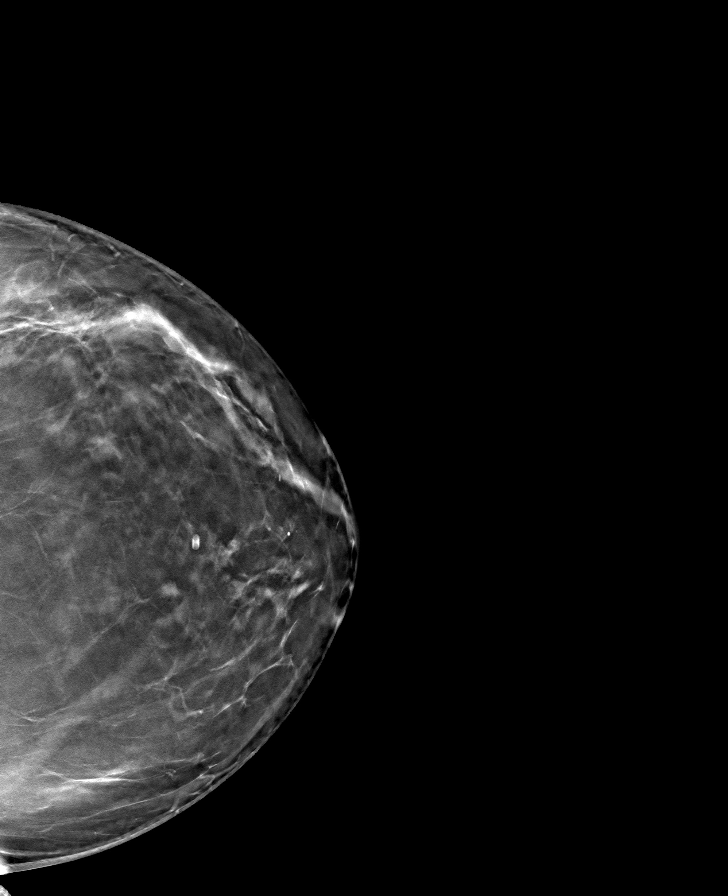

[R CC tomo · tomo slice 44/87.0]
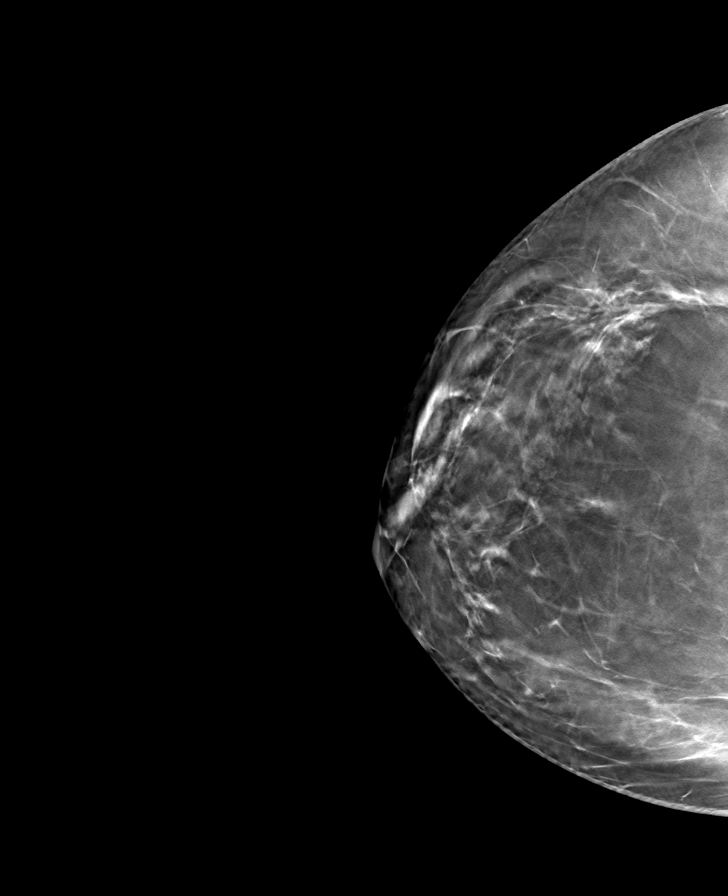

[L MLO tomo · tomo slice 47/94.0]
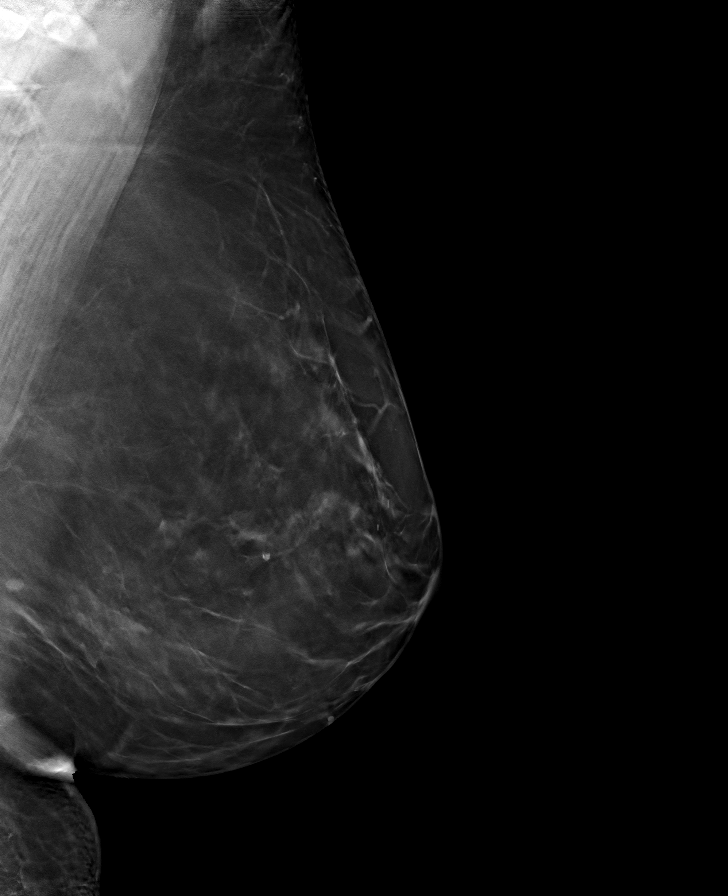

[8 of 24 positions shown; findings below may reference images not displayed]

ACR Breast Density Category b: There are scattered areas of
fibroglandular density.
FINDINGS: There are no findings suspicious for malignancy.
IMPRESSION: No mammographic evidence of malignancy. A result letter of this
screening mammogram will be mailed directly to the patient.

RECOMMENDATION:
Screening mammogram in one year. (Code:[BY])

BI-RADS CATEGORY  1: Negative.

## 2021-07-08 ENCOUNTER — Other Ambulatory Visit: Payer: Self-pay | Admitting: Obstetrics and Gynecology

## 2021-07-08 DIAGNOSIS — Z9189 Other specified personal risk factors, not elsewhere classified: Secondary | ICD-10-CM

## 2021-07-30 ENCOUNTER — Other Ambulatory Visit: Payer: Self-pay | Admitting: Registered Nurse

## 2021-07-30 DIAGNOSIS — I10 Essential (primary) hypertension: Secondary | ICD-10-CM

## 2021-08-20 ENCOUNTER — Ambulatory Visit
Admission: RE | Admit: 2021-08-20 | Discharge: 2021-08-20 | Disposition: A | Discharge status: Home / Self Care | Payer: PRIVATE HEALTH INSURANCE | Source: Ambulatory Visit | Attending: Obstetrics and Gynecology | Admitting: Obstetrics and Gynecology

## 2021-08-20 DIAGNOSIS — Z9189 Other specified personal risk factors, not elsewhere classified: Secondary | ICD-10-CM

## 2021-08-20 IMAGING — MR MR BREAST BILAT WO/W CM
8 of 12 series · 33 of 48 positions shown · IV contrast (gadavist)
Comparison: Previous exams, including prior breast MRIs dated
[DATE] and [DATE].

CLINICAL DATA: 60-year-old female with strong family history of
breast cancer, including in mother diagnosed with breast cancer at
age 60 as well as grandmother with history of breast cancer. Prior
benign stereotactic guided biopsy of the right breast [U0] and
history of right breast reduction mammoplasty [U0].

EXAM:
BILATERAL BREAST MRI WITH AND WITHOUT CONTRAST
TECHNIQUE: Multiplanar, multisequence MR images of both breasts were obtained
prior to and following the intravenous administration of 10 ml of
Gadavist

[Series 2: t2_tirm_tra ipat (a-p) · axial · 3.0mm · 0.70mm/px · 1 of 55 slices shown]
[im 1/55]
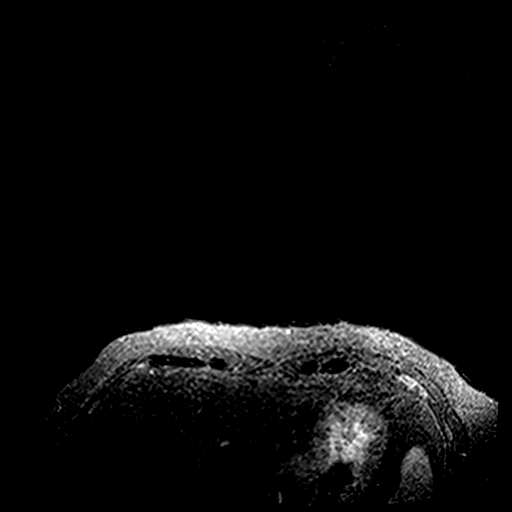

[Series 3: fl3d pre-cm no · axial · non-contrast · 1.2mm · 0.91mm/px · z∈[-91,+81]mm · 5 of 144 slices shown]
[im 1/144]
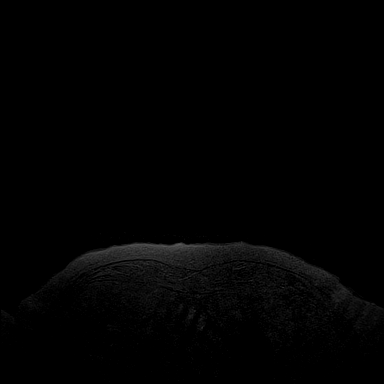
[im 36/144]
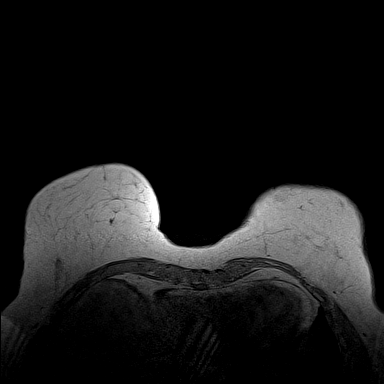
[im 72/144]
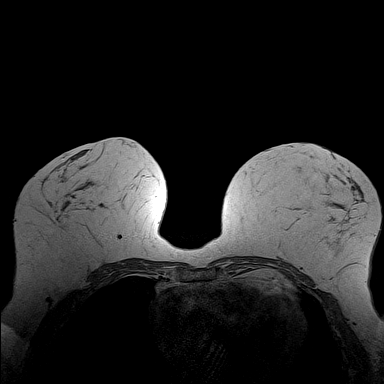
[im 108/144]
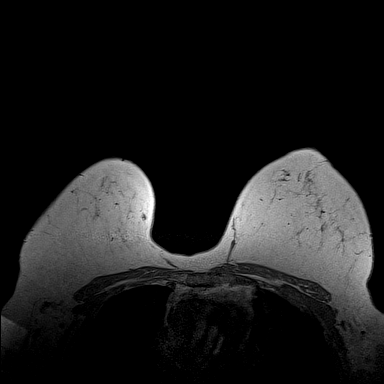
[im 144/144]
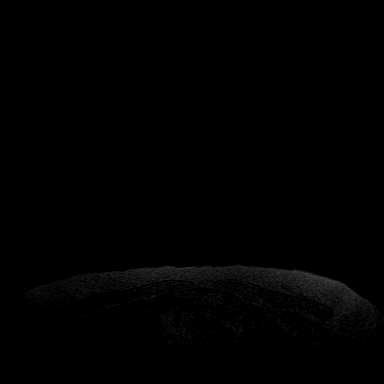

[Series 4: fl3d pre-cm · axial · non-contrast · 1.2mm · 0.94mm/px · z∈[-91,+81]mm · 5 of 144 slices shown]
[im 1/144]
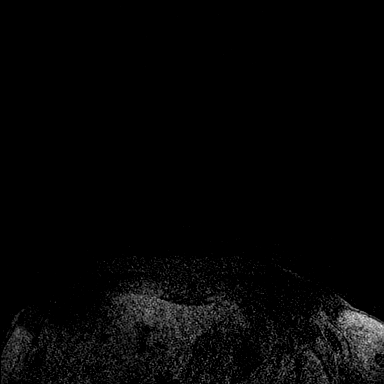
[im 36/144]
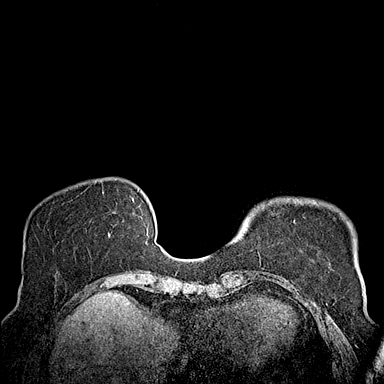
[im 72/144]
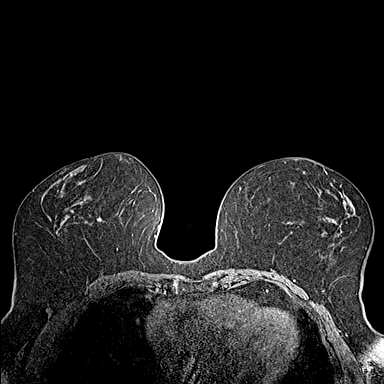
[im 108/144]
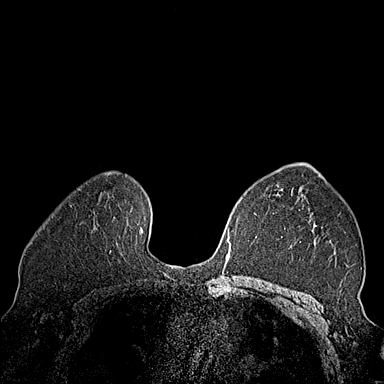
[im 144/144]
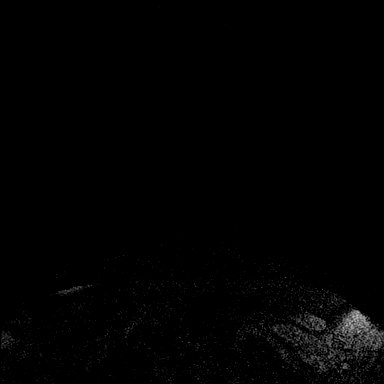

[Series 5: fl3d post-cm 20 · axial · 1.2mm · 0.94mm/px · z∈[-91,+81]mm · 5 of 144 slices shown (1 of 3)]
[im 1/144]
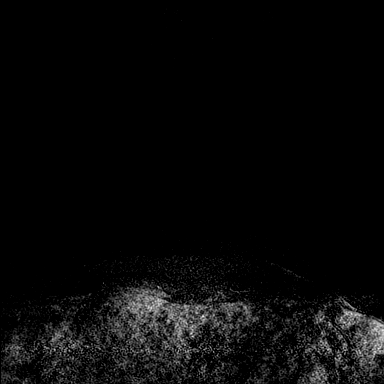
[im 36/144]
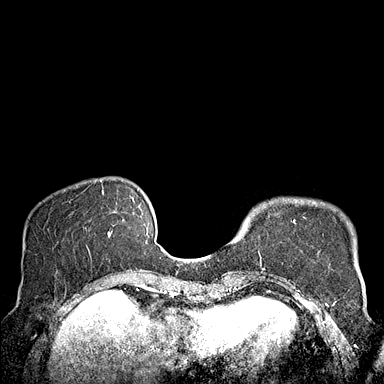
[im 72/144]
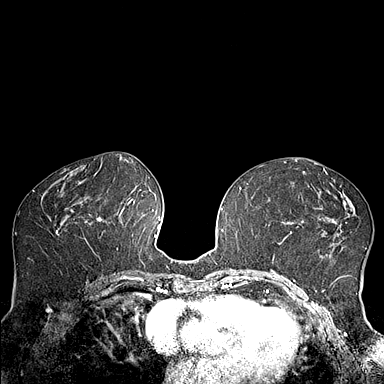
[im 108/144]
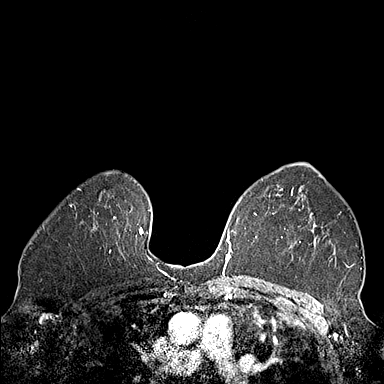
[im 144/144]
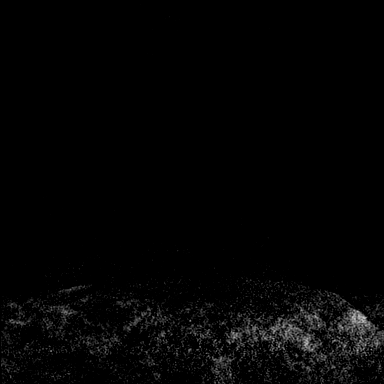

[Series 6: fl3d post-cm 20 · axial · 1.2mm · 0.94mm/px · z∈[-91,+81]mm · 5 of 144 slices shown (2 of 3)]
[im 1/144]
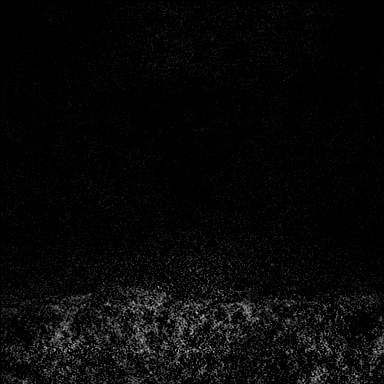
[im 36/144]
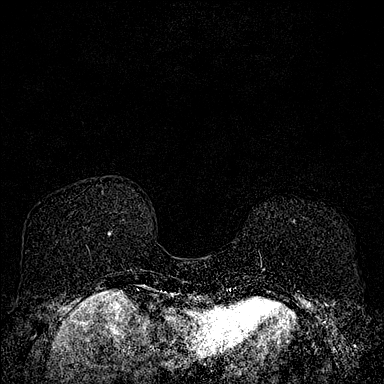
[im 72/144]
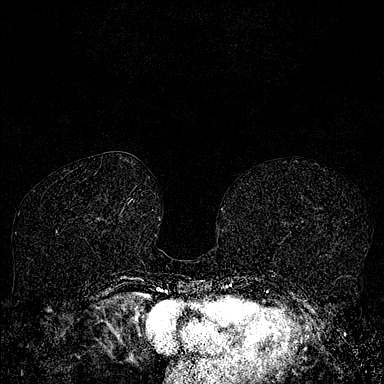
[im 108/144]
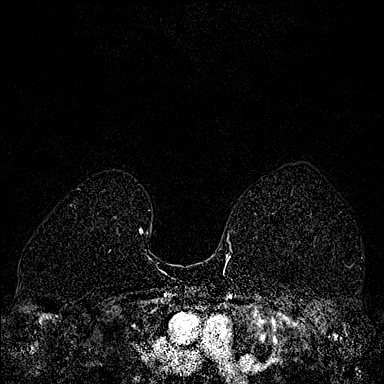
[im 144/144]
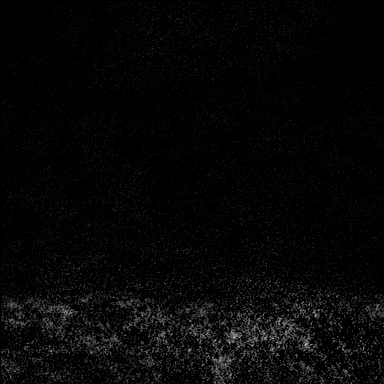

[Series 7: fl3d post-cm 20 · axial · 172.8mm · 0.94mm/px · 1 of 1 slices shown (3 of 3)]
[im 1/1]
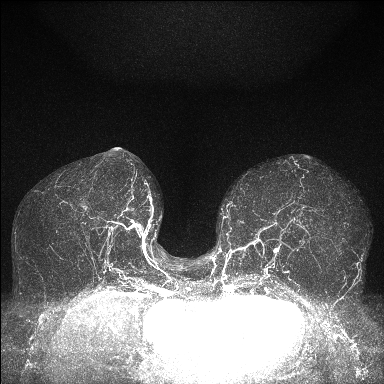

[Series 8: fl3d post-cm 3 · axial · 1.2mm · 0.94mm/px · z∈[-91,+81]mm · 6 of 144 slices shown (1 of 2)]
[im 1/144]
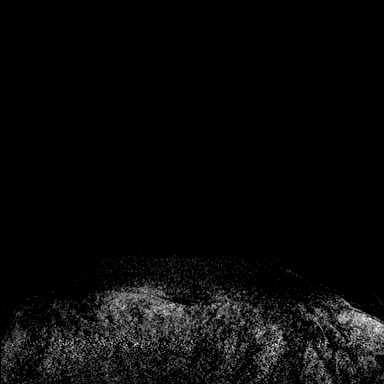
[im 29/144]
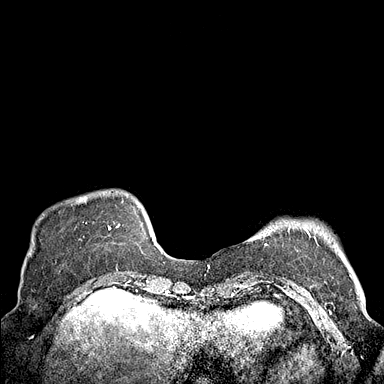
[im 58/144]
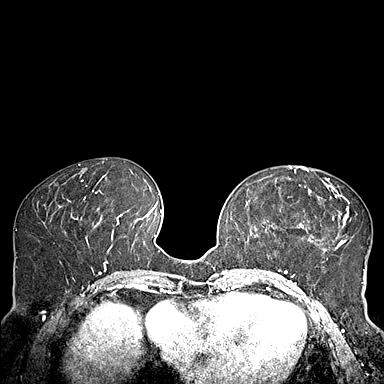
[im 86/144]
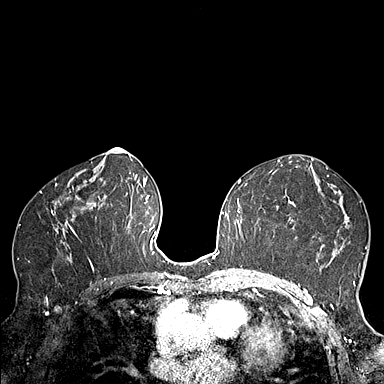
[im 115/144]
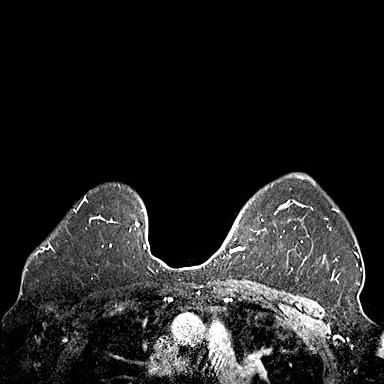
[im 144/144]
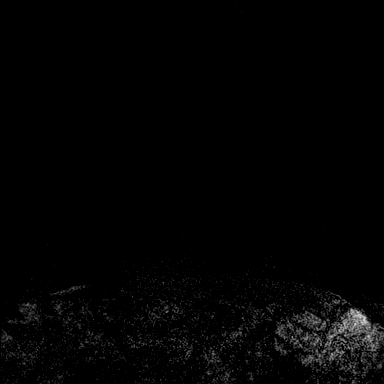

[Series 9: fl3d post-cm 3 · axial · 1.2mm · 0.94mm/px · z∈[-91,+46]mm · 5 of 144 slices shown (2 of 2)]
[im 1/144]
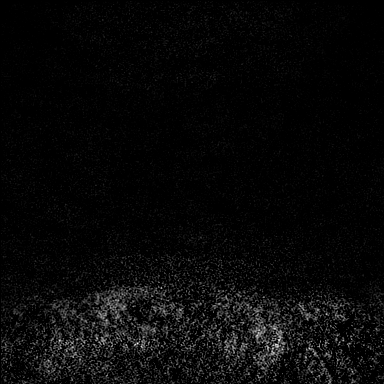
[im 29/144]
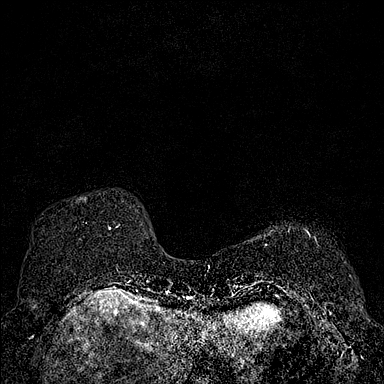
[im 58/144]
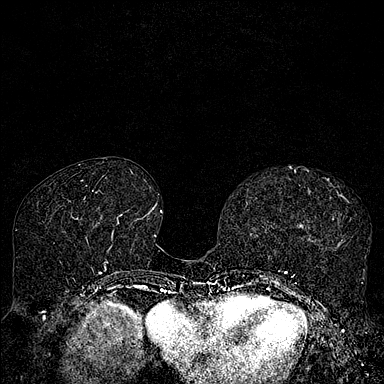
[im 86/144]
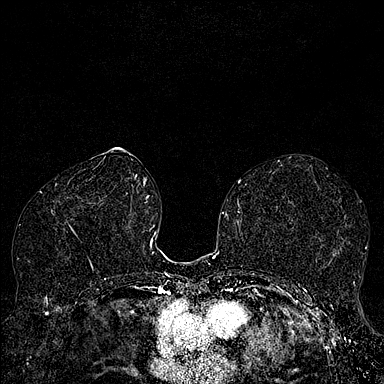
[im 115/144]
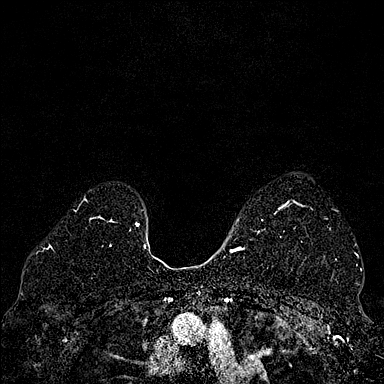

[33 of 48 positions shown; findings below may reference images not displayed]

Three-dimensional MR images were rendered by post-processing of the
original MR data on an independent workstation. The
three-dimensional MR images were interpreted, and findings are
reported in the following complete MRI report for this study. Three
dimensional images were evaluated at the independent interpreting
workstation using the DynaCAD thin client.
FINDINGS: Breast composition: b.  Scattered fibroglandular tissue.

Background parenchymal enhancement: Minimal.

Right breast: No suspicious enhancing masses or any other abnormal
areas of enhancement identified in the right breast. Artifact from
biopsy marking clip in the lower central right breast again
identified.

Left breast: No suspicious enhancing masses or any other abnormal
areas of enhancement identified in the left breast.

Lymph nodes: No abnormal appearing lymph nodes.

Ancillary findings:  None.
IMPRESSION: No MRI evidence of malignancy in either breast.

RECOMMENDATION:
1.  Recommend annual screening mammography, due [DATE].

2. The [employer] Cancer Society recommends annual MRI and mammography
in patients with an estimated lifetime risk of developing breast
cancer greater than 20 - 25%, or who are known or suspected to be
positive for the breast cancer gene.

BI-RADS CATEGORY  1: Negative.

## 2021-08-20 MED ORDER — GADOBUTROL 1 MMOL/ML IV SOLN
10.0000 mL | Freq: Once | INTRAVENOUS | Status: AC | PRN
  Administered 2021-08-20: 10 mL via INTRAVENOUS

## 2021-09-08 ENCOUNTER — Ambulatory Visit
Admission: RE | Admit: 2021-09-08 | Discharge: 2021-09-08 | Disposition: A | Discharge status: Home / Self Care | Payer: No Typology Code available for payment source | Source: Ambulatory Visit | Attending: Registered Nurse | Admitting: Registered Nurse

## 2021-09-08 DIAGNOSIS — I10 Essential (primary) hypertension: Secondary | ICD-10-CM

## 2021-09-08 IMAGING — CT CT CARDIAC CORONARY ARTERY CALCIUM SCORE
3 series · 14 of 20 positions shown, 16 images · non-contrast
Comparison: None.

CLINICAL DATA: 60-year-old Caucasian female with history of
hypertension.

EXAM:
CT CARDIAC CORONARY ARTERY CALCIUM SCORE
TECHNIQUE: Non-contrast imaging through the heart was performed using
prospective ECG gating. Image post processing was performed on an
independent workstation, allowing for quantitative analysis of the
heart and coronary arteries. Note that this exam targets the heart
and the chest was not imaged in its entirety.

[Series 2: calcium scoring 2.00 qr36 bestdiast 69% hrt calciu · axial · 0.38mm/px · z∈[+1718,+1802]mm · 4 of 70 slices shown]
[im 14/70  vessel]
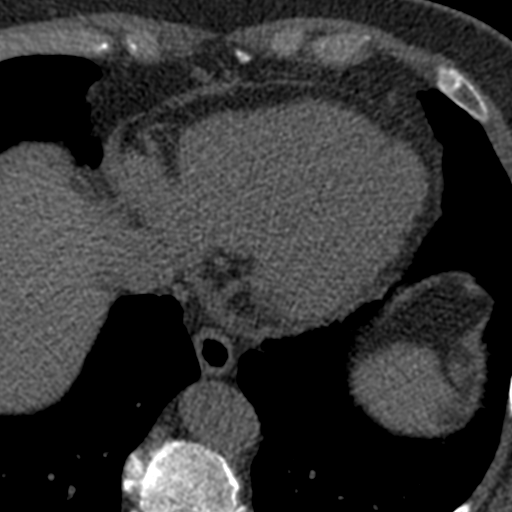
[im 28/70  vessel]
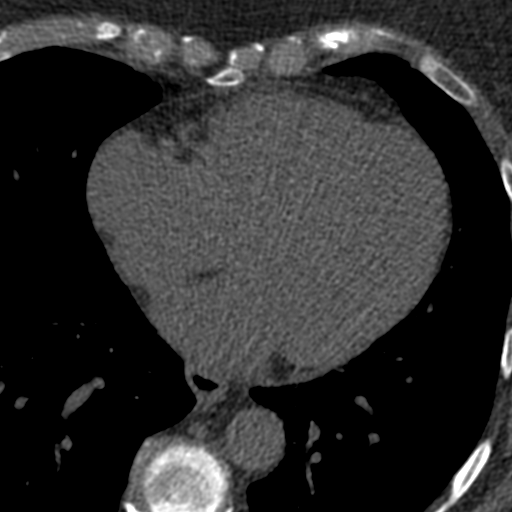
[im 42/70  vessel]
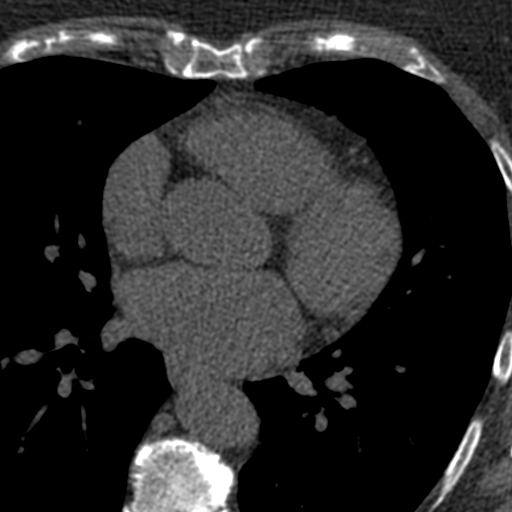
[im 56/70  vessel]
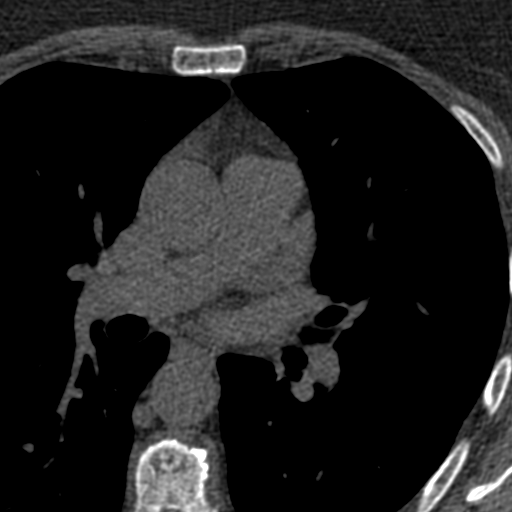

[Series 3: calcium scoring 2.00 br40 bestdiast 69% axial · axial · 0.59mm/px · z∈[+1714,+1806]mm · 5 of 70 slices shown, 7 images]
[im 12/70  vessel]
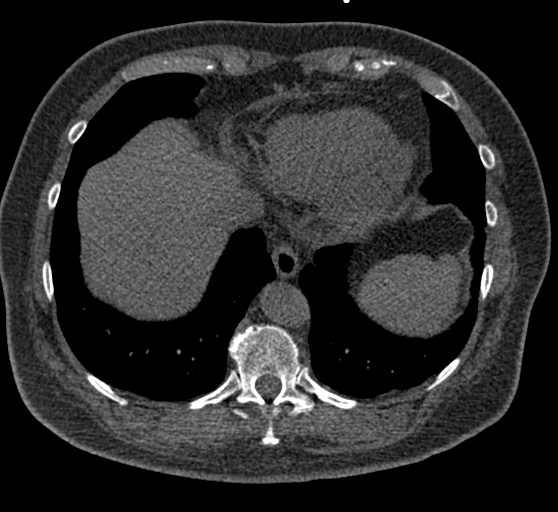
[im 12/70  lung]
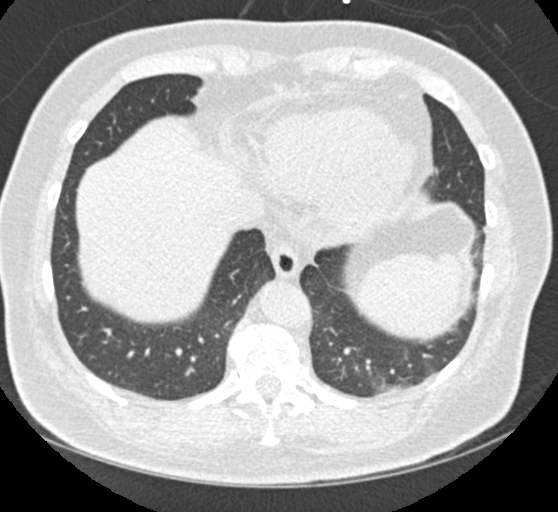
[im 24/70  vessel]
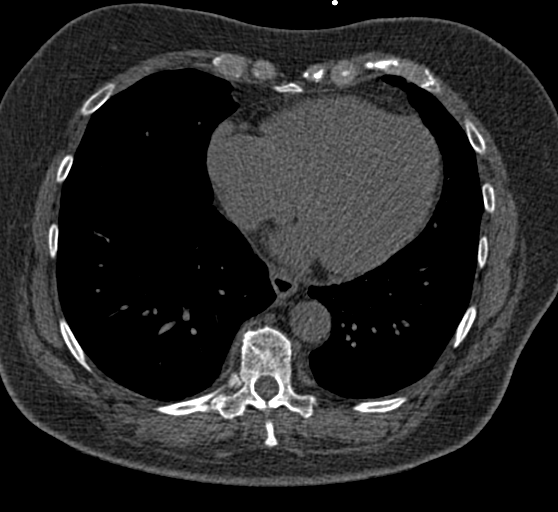
[im 35/70  vessel]
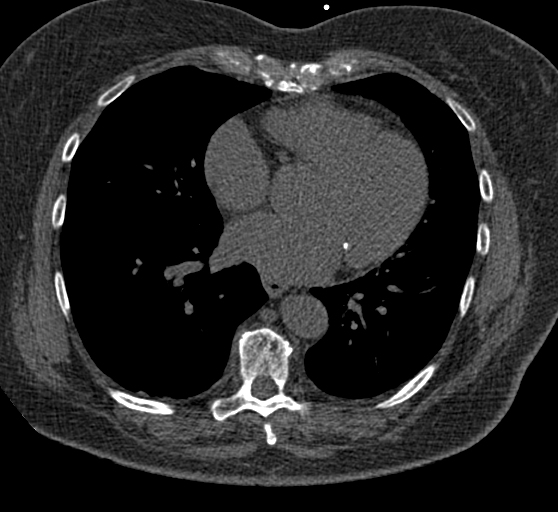
[im 47/70  vessel]
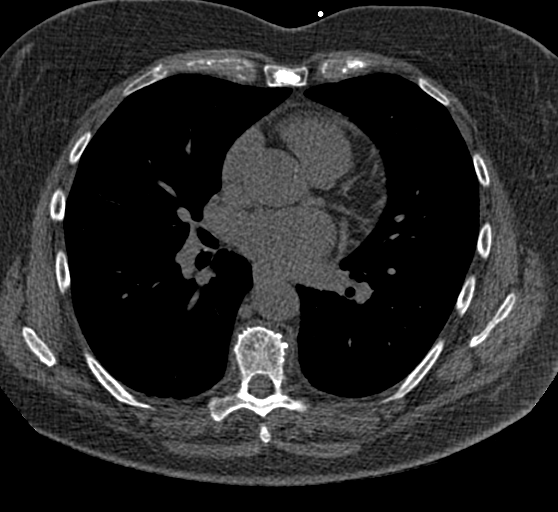
[im 58/70  vessel]
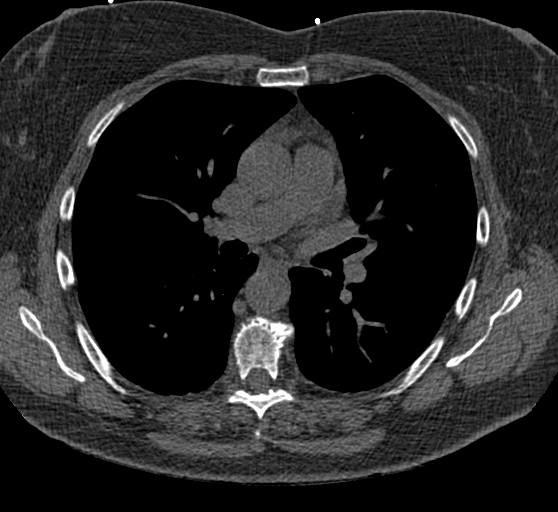
[im 58/70  lung]
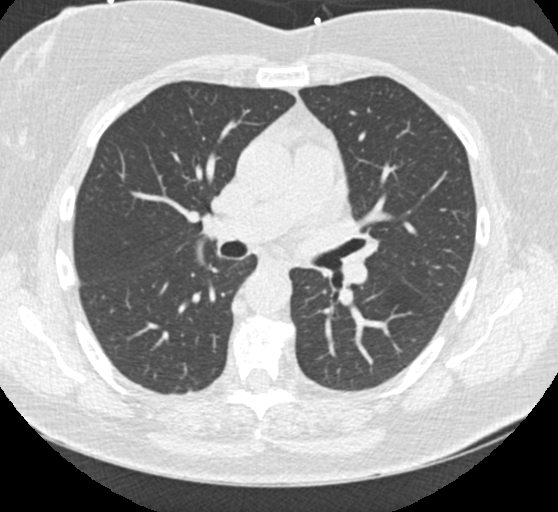

[Series 9: calcium scoring 2.00 br60 bestdiast 69% lungs · axial · 0.59mm/px · z∈[+1714,+1806]mm · 5 of 70 slices shown]
[im 12/70  vessel]
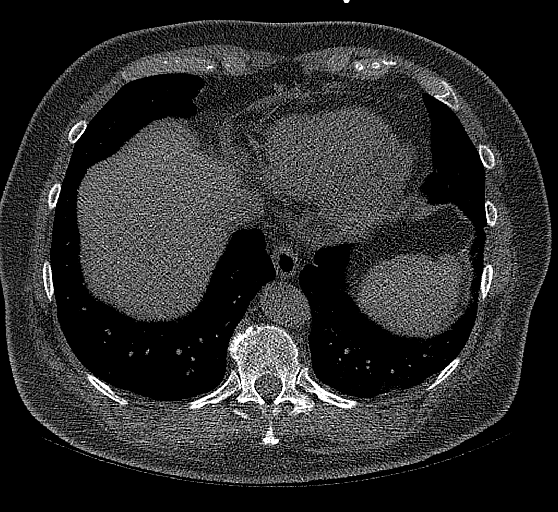
[im 24/70  vessel]
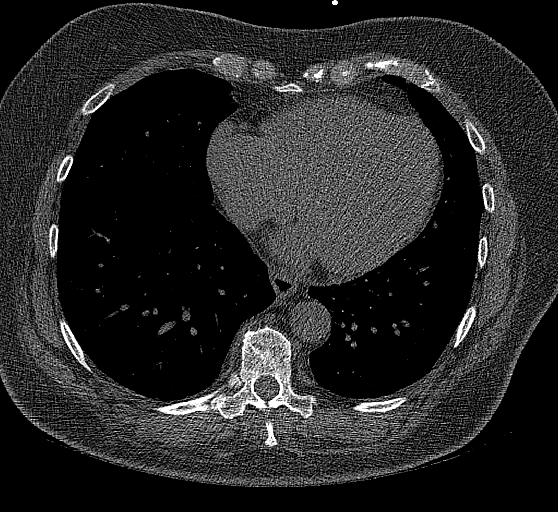
[im 35/70  vessel]
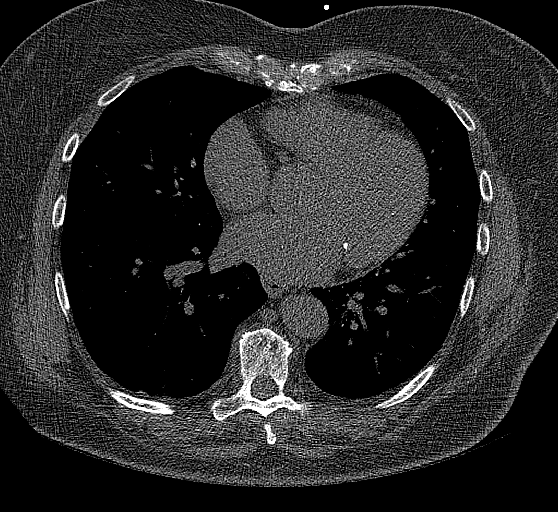
[im 47/70  vessel]
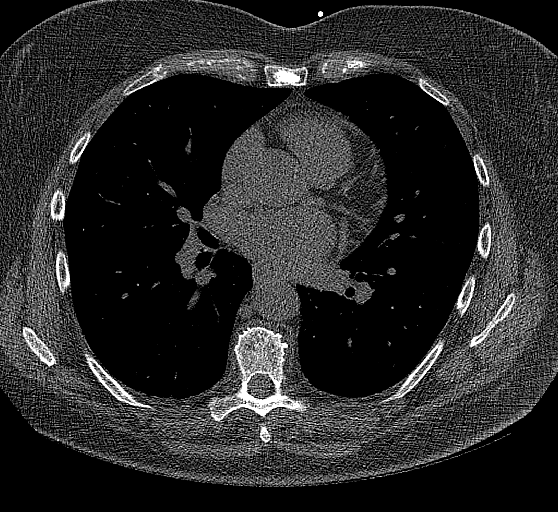
[im 58/70  vessel]
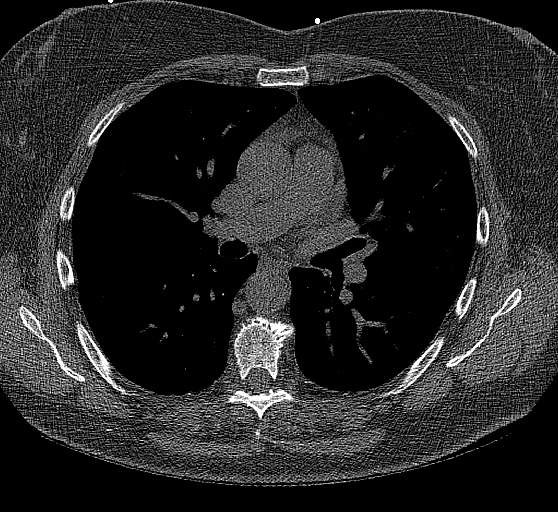

[14 of 20 positions shown; findings below may reference images not displayed]

FINDINGS: CORONARY CALCIUM SCORES:

Left Main: 0

LAD: 0

LCx: 0

RCA: 0

Total Agatston Score: 0

[HOSPITAL] percentile: 0

AORTA MEASUREMENTS:

Ascending Aorta: 34 mm

Descending Aorta: 25 mm

OTHER FINDINGS:

The heart size is within normal limits. Areas of focal calcification
associated with the mitral valve annulus and aortic valve. No
pericardial fluid is identified. Visualized segments of the thoracic
aorta and central pulmonary arteries are normal in caliber.
Visualized mediastinum and hilar regions demonstrate no
lymphadenopathy or masses. There are small calcified granulomata in
the right middle lobe and left lower lobe. Visualized lungs show no
evidence of pulmonary edema, consolidation, pneumothorax or pleural
fluid. Visualized upper abdomen and bony structures are
unremarkable.
IMPRESSION: 1. Coronary calcium score of 0.
2. Focal calcifications of the aortic valve and mitral valve
annulus.
3. Evidence of prior granulomatous disease with small calcified
granulomata in the right middle lobe and left lower lobe.

## 2021-10-29 ENCOUNTER — Other Ambulatory Visit (HOSPITAL_COMMUNITY): Payer: Self-pay | Admitting: Internal Medicine

## 2021-10-29 DIAGNOSIS — I359 Nonrheumatic aortic valve disorder, unspecified: Secondary | ICD-10-CM

## 2021-11-12 ENCOUNTER — Ambulatory Visit (HOSPITAL_COMMUNITY)
Admission: RE | Admit: 2021-11-12 | Discharge: 2021-11-12 | Disposition: A | Discharge status: Home / Self Care | Payer: PRIVATE HEALTH INSURANCE | Source: Ambulatory Visit | Attending: Internal Medicine | Admitting: Internal Medicine

## 2021-11-12 DIAGNOSIS — I358 Other nonrheumatic aortic valve disorders: Secondary | ICD-10-CM | POA: Insufficient documentation

## 2021-11-12 DIAGNOSIS — G4730 Sleep apnea, unspecified: Secondary | ICD-10-CM | POA: Insufficient documentation

## 2021-11-12 DIAGNOSIS — I359 Nonrheumatic aortic valve disorder, unspecified: Secondary | ICD-10-CM

## 2021-11-12 LAB — ECHOCARDIOGRAM COMPLETE
AR max vel: 2.93 cm2
AV Peak grad: 7.3 mmHg
Ao pk vel: 1.35 m/s
Area-P 1/2: 3.85 cm2
S' Lateral: 2.6 cm

## 2021-12-05 ENCOUNTER — Ambulatory Visit (INDEPENDENT_AMBULATORY_CARE_PROVIDER_SITE_OTHER): Payer: PRIVATE HEALTH INSURANCE | Admitting: Pulmonary Disease

## 2021-12-05 ENCOUNTER — Encounter: Payer: Self-pay | Admitting: Pulmonary Disease

## 2021-12-05 VITALS — BP 110/70 | HR 75 | Temp 97.5°F | Ht 64.0 in | Wt 182.4 lb

## 2021-12-05 DIAGNOSIS — G4733 Obstructive sleep apnea (adult) (pediatric): Secondary | ICD-10-CM | POA: Diagnosis not present

## 2021-12-17 ENCOUNTER — Telehealth: Payer: Self-pay | Admitting: Pulmonary Disease

## 2021-12-17 NOTE — Telephone Encounter (Signed)
Called patient but she did not answer. Left message for her to call back.  

## 2021-12-18 NOTE — Telephone Encounter (Signed)
Called the pt and there was no answer- LMTCB    

## 2021-12-18 NOTE — Telephone Encounter (Signed)
Called and spoke with patient who states that she needs compliance reports for CPAP sent to her insurance company because they are refusing to pay for her CPAP supplies. I have printed several 90 day compliance reports as well as her last ov note and faxed all of it to the number listed as [phone]. Nothing further needed at this time.

## 2022-07-13 ENCOUNTER — Other Ambulatory Visit: Payer: Self-pay | Admitting: Obstetrics and Gynecology

## 2022-07-13 DIAGNOSIS — Z9189 Other specified personal risk factors, not elsewhere classified: Secondary | ICD-10-CM

## 2022-07-15 ENCOUNTER — Other Ambulatory Visit: Payer: Self-pay | Admitting: Obstetrics and Gynecology

## 2022-07-15 DIAGNOSIS — Z1231 Encounter for screening mammogram for malignant neoplasm of breast: Secondary | ICD-10-CM

## 2022-08-14 ENCOUNTER — Ambulatory Visit
Admission: RE | Admit: 2022-08-14 | Discharge: 2022-08-14 | Disposition: A | Discharge status: Home / Self Care | Payer: PRIVATE HEALTH INSURANCE | Source: Ambulatory Visit | Attending: Obstetrics and Gynecology | Admitting: Obstetrics and Gynecology

## 2022-08-14 DIAGNOSIS — Z1231 Encounter for screening mammogram for malignant neoplasm of breast: Secondary | ICD-10-CM

## 2023-01-08 ENCOUNTER — Encounter (HOSPITAL_BASED_OUTPATIENT_CLINIC_OR_DEPARTMENT_OTHER): Payer: Self-pay | Admitting: Pulmonary Disease

## 2023-01-08 ENCOUNTER — Ambulatory Visit (HOSPITAL_BASED_OUTPATIENT_CLINIC_OR_DEPARTMENT_OTHER): Payer: PRIVATE HEALTH INSURANCE | Admitting: Pulmonary Disease

## 2023-01-08 VITALS — BP 106/70 | HR 77 | Resp 12 | Ht 64.0 in | Wt 172.2 lb

## 2023-01-08 DIAGNOSIS — G4733 Obstructive sleep apnea (adult) (pediatric): Secondary | ICD-10-CM

## 2023-01-08 NOTE — Progress Notes (Signed)
[facility]  Chief Complaint  Patient presents with   Follow-up    Follow up     Past Surgical History:  She  has a past surgical history that includes Abdominal hysterectomy (12/2001); Cesarean section (2001); Nasal sinus surgery (04/2011); Tubal ligation (03/2000); Bilateral temporomandibular joint arthroplasty; Reduction mammaplasty (Bilateral); Mandible surgery; and Breast biopsy (Right, 2020).  Past Medical History:  Allergies, Overactive bladder, HTN, Depression  Constitutional:  BP 106/70   Pulse 77   Resp 12   Ht 5\' 4"  (1.626 m)   Wt 172 lb 3.2 oz (78.1 kg)   SpO2 96%   BMI 29.56 kg/m   Brief Summary:  Maria Stewart is a 62 y.o. female with obstructive sleep apnea.      Subjective:   She is making progress with weight loss.  She is hoping to get her weight down to 160 lbs.  She loves using her CPAP.  She doesn't feel like she can sleep without it.  No issues with mask fit or pressure setting.  She has TMJ.  Her husband is a Education officer, community.  She was tried on oral appliance, but wasn't able to tolerate this.  Physical Exam:   Appearance - well kempt   ENMT - no sinus tenderness, no oral exudate, no LAN, Mallampati 4 airway, no stridor  Respiratory - equal breath sounds bilaterally, no wheezing or rales  CV - s1s2 regular rate and rhythm, no murmurs  Ext - no clubbing, no edema  Skin - no rashes  Psych - normal mood and affect   Pulmonary testing:    Chest Imaging:  Cardiac CT 09/08/21 >> calcified granulomas in RML and LLL, coronary calcium score 0  Sleep Tests:  PSG 05/13/12 >> AHI 11.6, SpO2 low 87% HST 07/12/19 >> AHI 8.5, SpO2 low 88%. Auto CPAP 09/05/21 to 12/03/21 >> used on 90 of 90 nights with average 8 hrs 9 min.  Average AHI 1.7 with median CPAP 8 and 95 th percentile CPAP 10 cm H2O  Cardiac Tests:  Echo 11/12/21 >> EF 60 to 65%  Social History:  She  reports that she has never smoked. She has never  used smokeless tobacco. She reports current alcohol use. She reports that she does not use drugs.  Family History:  Her family history includes Breast cancer in her maternal grandmother and mother; Kidney cancer in her father; Liver cancer in her father; Melanoma in her mother; Prostate cancer in her father; Thyroid disease in her mother.     Assessment/Plan:   Obstructive sleep apnea. - she is compliant with CPAP and reports benefit - she uses [employer] for her DME - current CPAP was ordered on 07/24/19 - continue auto CPAP 5 to 15 cm H2O  Obesity. - making good progress with ozempic  Time Spent Involved in Patient Care on Day of Examination:  16 minutes  Follow up:   Patient Instructions  Follow up in 1 year  Medication List:   Allergies as of 01/08/2023   No Known Allergies      Medication List        Accurate as of January 08, 2023  9:25 AM. If you have any questions, ask your nurse or doctor.          escitalopram 5 MG tablet Commonly known as: LEXAPRO TAKE 1 TABLET ONCE A DAY ORALLY 90 DAYS   losartan 100 MG tablet Commonly known as: COZAAR Take 1 tablet by mouth daily.  Myrbetriq 25 MG Tb24 tablet Generic drug: mirabegron ER Take 25 mg by mouth daily.   Ozempic (1 MG/DOSE) 4 MG/3ML Sopn Generic drug: Semaglutide (1 MG/DOSE) SMARTSIG:1 Milligram(s) SUB-Q Every 4 Weeks        Signature:  Coralyn Helling, MD [facility] Pager - [phone] 01/08/2023, 9:25 AM

## 2023-01-08 NOTE — Patient Instructions (Signed)
Follow up in 1 year.

## 2023-02-11 ENCOUNTER — Encounter: Payer: Self-pay | Admitting: Obstetrics and Gynecology

## 2023-02-16 ENCOUNTER — Encounter: Payer: Self-pay | Admitting: Obstetrics and Gynecology

## 2023-02-18 ENCOUNTER — Ambulatory Visit
Admission: RE | Admit: 2023-02-18 | Discharge: 2023-02-18 | Disposition: A | Discharge status: Home / Self Care | Payer: PRIVATE HEALTH INSURANCE | Source: Ambulatory Visit | Attending: Obstetrics and Gynecology | Admitting: Obstetrics and Gynecology

## 2023-02-18 DIAGNOSIS — Z9189 Other specified personal risk factors, not elsewhere classified: Secondary | ICD-10-CM

## 2023-02-18 MED ORDER — GADOPICLENOL 0.5 MMOL/ML IV SOLN
8.0000 mL | Freq: Once | INTRAVENOUS | Status: AC | PRN
  Administered 2023-02-18: 8 mL via INTRAVENOUS

## 2023-06-28 DIAGNOSIS — G4733 Obstructive sleep apnea (adult) (pediatric): Secondary | ICD-10-CM | POA: Diagnosis not present

## 2023-07-20 ENCOUNTER — Other Ambulatory Visit: Payer: Self-pay | Admitting: Obstetrics and Gynecology

## 2023-07-20 DIAGNOSIS — Z1231 Encounter for screening mammogram for malignant neoplasm of breast: Secondary | ICD-10-CM

## 2023-07-29 DIAGNOSIS — G4733 Obstructive sleep apnea (adult) (pediatric): Secondary | ICD-10-CM | POA: Diagnosis not present

## 2023-08-03 DIAGNOSIS — R319 Hematuria, unspecified: Secondary | ICD-10-CM | POA: Diagnosis not present

## 2023-08-03 DIAGNOSIS — Z6831 Body mass index (BMI) 31.0-31.9, adult: Secondary | ICD-10-CM | POA: Diagnosis not present

## 2023-08-03 DIAGNOSIS — Z01419 Encounter for gynecological examination (general) (routine) without abnormal findings: Secondary | ICD-10-CM | POA: Diagnosis not present

## 2023-08-17 ENCOUNTER — Ambulatory Visit
Admission: RE | Admit: 2023-08-17 | Discharge: 2023-08-17 | Disposition: A | Discharge status: Home / Self Care | Payer: No Typology Code available for payment source | Source: Ambulatory Visit | Attending: Obstetrics and Gynecology | Admitting: Obstetrics and Gynecology

## 2023-08-17 DIAGNOSIS — Z1231 Encounter for screening mammogram for malignant neoplasm of breast: Secondary | ICD-10-CM | POA: Diagnosis not present

## 2023-08-26 DIAGNOSIS — G4733 Obstructive sleep apnea (adult) (pediatric): Secondary | ICD-10-CM | POA: Diagnosis not present

## 2023-09-27 DIAGNOSIS — I10 Essential (primary) hypertension: Secondary | ICD-10-CM | POA: Diagnosis not present

## 2023-09-27 DIAGNOSIS — E559 Vitamin D deficiency, unspecified: Secondary | ICD-10-CM | POA: Diagnosis not present

## 2023-09-27 DIAGNOSIS — Z0000 Encounter for general adult medical examination without abnormal findings: Secondary | ICD-10-CM | POA: Diagnosis not present

## 2023-10-04 DIAGNOSIS — Z0000 Encounter for general adult medical examination without abnormal findings: Secondary | ICD-10-CM | POA: Diagnosis not present

## 2023-10-06 DIAGNOSIS — Z13820 Encounter for screening for osteoporosis: Secondary | ICD-10-CM | POA: Diagnosis not present

## 2023-10-29 DIAGNOSIS — G4733 Obstructive sleep apnea (adult) (pediatric): Secondary | ICD-10-CM | POA: Diagnosis not present

## 2023-11-29 DIAGNOSIS — G4733 Obstructive sleep apnea (adult) (pediatric): Secondary | ICD-10-CM | POA: Diagnosis not present

## 2023-12-09 DIAGNOSIS — M1711 Unilateral primary osteoarthritis, right knee: Secondary | ICD-10-CM | POA: Diagnosis not present

## 2023-12-14 ENCOUNTER — Other Ambulatory Visit: Payer: Self-pay | Admitting: Obstetrics and Gynecology

## 2023-12-14 DIAGNOSIS — Z803 Family history of malignant neoplasm of breast: Secondary | ICD-10-CM

## 2023-12-16 DIAGNOSIS — E6609 Other obesity due to excess calories: Secondary | ICD-10-CM | POA: Diagnosis not present

## 2023-12-16 DIAGNOSIS — Z6833 Body mass index (BMI) 33.0-33.9, adult: Secondary | ICD-10-CM | POA: Diagnosis not present

## 2023-12-30 DIAGNOSIS — G4733 Obstructive sleep apnea (adult) (pediatric): Secondary | ICD-10-CM | POA: Diagnosis not present

## 2024-01-10 DIAGNOSIS — M1711 Unilateral primary osteoarthritis, right knee: Secondary | ICD-10-CM | POA: Diagnosis not present

## 2024-01-12 DIAGNOSIS — D2262 Melanocytic nevi of left upper limb, including shoulder: Secondary | ICD-10-CM | POA: Diagnosis not present

## 2024-01-12 DIAGNOSIS — D225 Melanocytic nevi of trunk: Secondary | ICD-10-CM | POA: Diagnosis not present

## 2024-01-12 DIAGNOSIS — D2261 Melanocytic nevi of right upper limb, including shoulder: Secondary | ICD-10-CM | POA: Diagnosis not present

## 2024-01-12 DIAGNOSIS — L814 Other melanin hyperpigmentation: Secondary | ICD-10-CM | POA: Diagnosis not present

## 2024-01-30 DIAGNOSIS — G4733 Obstructive sleep apnea (adult) (pediatric): Secondary | ICD-10-CM | POA: Diagnosis not present

## 2024-02-22 ENCOUNTER — Ambulatory Visit
Admission: RE | Admit: 2024-02-22 | Discharge: 2024-02-22 | Disposition: A | Discharge status: Home / Self Care | Source: Ambulatory Visit | Attending: Obstetrics and Gynecology | Admitting: Obstetrics and Gynecology

## 2024-02-22 DIAGNOSIS — Z803 Family history of malignant neoplasm of breast: Secondary | ICD-10-CM | POA: Diagnosis not present

## 2024-02-22 DIAGNOSIS — Z1231 Encounter for screening mammogram for malignant neoplasm of breast: Secondary | ICD-10-CM | POA: Diagnosis not present

## 2024-02-22 MED ORDER — GADOPICLENOL 0.5 MMOL/ML IV SOLN
8.0000 mL | Freq: Once | INTRAVENOUS | Status: AC | PRN
  Administered 2024-02-22: 8 mL via INTRAVENOUS

## 2024-02-24 DIAGNOSIS — Z6833 Body mass index (BMI) 33.0-33.9, adult: Secondary | ICD-10-CM | POA: Diagnosis not present

## 2024-02-24 DIAGNOSIS — E6609 Other obesity due to excess calories: Secondary | ICD-10-CM | POA: Diagnosis not present

## 2024-03-01 DIAGNOSIS — G4733 Obstructive sleep apnea (adult) (pediatric): Secondary | ICD-10-CM | POA: Diagnosis not present

## 2024-03-03 DIAGNOSIS — M1711 Unilateral primary osteoarthritis, right knee: Secondary | ICD-10-CM | POA: Diagnosis not present

## 2024-03-10 DIAGNOSIS — M1711 Unilateral primary osteoarthritis, right knee: Secondary | ICD-10-CM | POA: Diagnosis not present

## 2024-03-17 DIAGNOSIS — M1711 Unilateral primary osteoarthritis, right knee: Secondary | ICD-10-CM | POA: Diagnosis not present

## 2024-04-12 DIAGNOSIS — M1711 Unilateral primary osteoarthritis, right knee: Secondary | ICD-10-CM | POA: Diagnosis not present

## 2024-04-27 ENCOUNTER — Encounter (HOSPITAL_BASED_OUTPATIENT_CLINIC_OR_DEPARTMENT_OTHER): Admitting: Pulmonary Disease

## 2024-07-24 ENCOUNTER — Encounter (HOSPITAL_BASED_OUTPATIENT_CLINIC_OR_DEPARTMENT_OTHER): Admitting: Pulmonary Disease
